# Patient Record
Sex: Female | Born: 1940 | Race: White | Hispanic: No | Marital: Single | Smoking: Former smoker
Health system: Southern US, Community
[De-identification: ages and names within clinical notes are randomized; demographics above are authoritative.]

## PROBLEM LIST (undated history)

## (undated) DIAGNOSIS — M1990 Unspecified osteoarthritis, unspecified site: Secondary | ICD-10-CM

## (undated) DIAGNOSIS — E559 Vitamin D deficiency, unspecified: Secondary | ICD-10-CM

## (undated) DIAGNOSIS — I10 Essential (primary) hypertension: Secondary | ICD-10-CM

## (undated) DIAGNOSIS — M8580 Other specified disorders of bone density and structure, unspecified site: Secondary | ICD-10-CM

## (undated) DIAGNOSIS — R200 Anesthesia of skin: Secondary | ICD-10-CM

## (undated) DIAGNOSIS — F32A Depression, unspecified: Secondary | ICD-10-CM

## (undated) DIAGNOSIS — E785 Hyperlipidemia, unspecified: Secondary | ICD-10-CM

## (undated) DIAGNOSIS — F329 Major depressive disorder, single episode, unspecified: Secondary | ICD-10-CM

## (undated) HISTORY — DX: Essential (primary) hypertension: I10

## (undated) HISTORY — DX: Anesthesia of skin: R20.0

## (undated) HISTORY — DX: Major depressive disorder, single episode, unspecified: F32.9

## (undated) HISTORY — DX: Depression, unspecified: F32.A

## (undated) HISTORY — PX: ABDOMINAL HYSTERECTOMY: SHX81

## (undated) HISTORY — DX: Vitamin D deficiency, unspecified: E55.9

## (undated) HISTORY — DX: Other specified disorders of bone density and structure, unspecified site: M85.80

---

## 1997-11-08 ENCOUNTER — Ambulatory Visit (HOSPITAL_COMMUNITY): Admission: RE | Admit: 1997-11-08 | Discharge: 1997-11-08 | Payer: Self-pay | Admitting: Gynecology

## 1997-12-13 ENCOUNTER — Ambulatory Visit: Admission: RE | Admit: 1997-12-13 | Discharge: 1997-12-13 | Payer: Self-pay | Admitting: Gynecology

## 1998-01-02 ENCOUNTER — Emergency Department (HOSPITAL_COMMUNITY): Admission: EM | Admit: 1998-01-02 | Discharge: 1998-01-02 | Payer: Self-pay | Admitting: Emergency Medicine

## 1998-09-01 ENCOUNTER — Encounter: Payer: Self-pay | Admitting: Family Medicine

## 1998-09-01 ENCOUNTER — Ambulatory Visit (HOSPITAL_COMMUNITY): Admission: RE | Admit: 1998-09-01 | Discharge: 1998-09-01 | Payer: Self-pay | Admitting: Family Medicine

## 1999-10-29 ENCOUNTER — Emergency Department (HOSPITAL_COMMUNITY): Admission: EM | Admit: 1999-10-29 | Discharge: 1999-10-29 | Payer: Self-pay | Admitting: Emergency Medicine

## 2000-04-10 ENCOUNTER — Other Ambulatory Visit: Admission: RE | Admit: 2000-04-10 | Discharge: 2000-04-10 | Payer: Self-pay | Admitting: Gynecology

## 2000-08-02 ENCOUNTER — Encounter: Payer: Self-pay | Admitting: Family Medicine

## 2000-08-02 ENCOUNTER — Ambulatory Visit (HOSPITAL_COMMUNITY): Admission: RE | Admit: 2000-08-02 | Discharge: 2000-08-02 | Payer: Self-pay | Admitting: Family Medicine

## 2001-05-06 ENCOUNTER — Other Ambulatory Visit: Admission: RE | Admit: 2001-05-06 | Discharge: 2001-05-06 | Payer: Self-pay | Admitting: Gynecology

## 2001-07-22 ENCOUNTER — Ambulatory Visit (HOSPITAL_COMMUNITY): Admission: RE | Admit: 2001-07-22 | Discharge: 2001-07-22 | Payer: Self-pay | Admitting: *Deleted

## 2001-07-28 ENCOUNTER — Encounter: Payer: Self-pay | Admitting: *Deleted

## 2001-07-28 ENCOUNTER — Ambulatory Visit (HOSPITAL_COMMUNITY): Admission: RE | Admit: 2001-07-28 | Discharge: 2001-07-28 | Payer: Self-pay | Admitting: *Deleted

## 2001-08-19 ENCOUNTER — Ambulatory Visit (HOSPITAL_COMMUNITY): Admission: RE | Admit: 2001-08-19 | Discharge: 2001-08-19 | Payer: Self-pay | Admitting: Gastroenterology

## 2001-08-19 ENCOUNTER — Encounter (INDEPENDENT_AMBULATORY_CARE_PROVIDER_SITE_OTHER): Payer: Self-pay | Admitting: Specialist

## 2001-09-15 ENCOUNTER — Encounter (INDEPENDENT_AMBULATORY_CARE_PROVIDER_SITE_OTHER): Payer: Self-pay | Admitting: *Deleted

## 2001-09-15 ENCOUNTER — Ambulatory Visit (HOSPITAL_BASED_OUTPATIENT_CLINIC_OR_DEPARTMENT_OTHER): Admission: RE | Admit: 2001-09-15 | Discharge: 2001-09-15 | Payer: Self-pay | Admitting: Surgery

## 2002-05-06 ENCOUNTER — Other Ambulatory Visit: Admission: RE | Admit: 2002-05-06 | Discharge: 2002-05-06 | Payer: Self-pay | Admitting: Gynecology

## 2005-08-15 ENCOUNTER — Encounter: Admission: RE | Admit: 2005-08-15 | Discharge: 2005-08-15 | Payer: Self-pay | Admitting: Emergency Medicine

## 2005-09-11 ENCOUNTER — Ambulatory Visit (HOSPITAL_COMMUNITY): Admission: RE | Admit: 2005-09-11 | Discharge: 2005-09-11 | Payer: Self-pay | Admitting: Specialist

## 2009-01-25 ENCOUNTER — Ambulatory Visit: Payer: Self-pay | Admitting: Gynecology

## 2009-01-25 ENCOUNTER — Other Ambulatory Visit: Admission: RE | Admit: 2009-01-25 | Discharge: 2009-01-25 | Payer: Self-pay | Admitting: Gynecology

## 2009-01-25 ENCOUNTER — Encounter: Payer: Self-pay | Admitting: Gynecology

## 2010-11-24 NOTE — Op Note (Signed)
[place]. [facility]  Patient:    Nancy Schmidt, Nancy Schmidt Visit Number: [id number] MRN: [id number]          Service Type: DSU Location: [facility] Attending Physician:  Charlton Haws Dictated by:   Currie Paris, M.D. Proc. Date: 09/15/01 Admit Date:  09/15/2001   CC:         Gladstone Pih, M.D.  Anselmo Rod, M.D.   Operative Report  VISIT NUMBER:  [id number]  [id number]  PREOPERATIVE DIAGNOSIS:  Perianal and intra-anal condylomata.  OPERATION:  Excision and laser of anal condylomata.  SURGEON:  Currie Paris, M.D.  ANESTHESIA:  General.  CLINICAL HISTORY:  The patient is a 70 year old, who has an external skin tag with what appears to be a large condyloma about 1 cm on the end of it.  There was one tiny, less than 1 mm adjacent what appeared to be condyloma.  The remainder of the external perianal area looked okay.  On internal exam, there appeared to be a small cluster of anal condyloma anteriorly and a couple of tiny ones posteriorly but otherwise free.  She has modest hemorrhoidal disease.  DESCRIPTION OF PROCEDURE:  The patient was seen in the holding area and had no further questions.  She was taken to the operating room and after satisfactory general anesthesia, was placed in the prone jackknife position.  The perianal area was prepped and draped.  I put some local using 0.25% Marcaine with epinephrine under the skin tag which was located on the left side and was fairly prominent.  I then did the anoscopic exam, as noted above with the findings as above.  I put a little local again underneath the little cluster of what appeared to be condylomata anteriorly.  I took a small biopsy of one just to confirm that the diagnosis was correct and then lasered all of the remaining area which was an area of about a square centimeter.  This looked fine with no significant other problems noted.  I then lasered the two little areas  anteriorly.  Attention was turned to the external one which was cut off because there was a fairly good skin tag associated with it.  The skin was reapproximated with a running 3-0 chromic.  There were a couple of tiny areas that were then lasered and then with the laser beam defocused, I "painted" the area around this to be sure we did not have any contamination here.  Final check was made with the scope again to make sure everything looked dry, no problems internally.  Everything there looked fine.  The area was dressed. The patient tolerated the procedure well.  There were no operative complications.  All counts were correct. Dictated by:   Currie Paris, M.D. Attending Physician:  Charlton Haws DD:  09/15/01 TD:  09/15/01 Job: [id number]

## 2010-11-24 NOTE — Procedures (Signed)
[place]. [facility]  Patient:    Nancy Schmidt, STANBROUGH Visit Number: [id number] MRN: [id number]          Service Type: END Location: ENDO Attending Physician:  Charna Elizabeth Dictated by:   Anselmo Rod, M.D. Proc. Date: 08/19/01 Admit Date:  08/19/2001   CC:         Juan H. Lily Peer, M.D.  Currie Paris, M.D.   Procedure Report  DATE OF BIRTH:  January 04, 1931.  PROCEDURE:  Colonoscopy with cold biopsy x1.  ENDOSCOPIST:  Anselmo Rod, M.D.  INSTRUMENT USED:  Olympus video colonoscope.  INDICATION FOR PROCEDURE:  Rectal bleeding in a 70 year old white female. Rule out colonic polyps, masses, hemorrhoids, etc.  PREPROCEDURE PREPARATION:  Informed consent was procured from the patient. The patient was fasted for eight hours prior to the procedure and prepped with a bottle of magnesium citrate and a gallon of NuLytely the night prior to the procedure.  PREPROCEDURE PHYSICAL:  VITAL SIGNS:  The patient had stable vital signs.  NECK:  Supple.  CHEST:  Clear to auscultation.  S1, S2 regular.  ABDOMEN:  Soft with normal bowel sounds.  DESCRIPTION OF PROCEDURE:  The patient was placed in the left lateral decubitus position and sedated with 70 mg of Demerol and 7 mg of Versed intravenously.  Once the patient was adequately sedate and maintained on low-flow oxygen and continuous cardiac monitoring, the Olympus video colonoscope was advanced from the rectum to the cecum without difficulty. Small external hemorrhoids were appreciated with small anal condyloma seen associated with external hemorrhoids.  On retroflexion in the rectum, internal condyloma was seen close to the dentate line.  There was some left-sided diverticulosis appreciated in addition to the small external and internal hemorrhoids.  No masses or polyps were seen in the colon except for a small sessile polyp that was biopsied from 20 cm.  The procedure was complete up to the  cecum.  The ileocecal valve and the appendiceal orifice were clearly visualized and photographed.  IMPRESSION: 1. Left-sided diverticulosis. 2. Small internal-external hemorrhoids. 3. A small anal condyloma associated with external hemorrhoids and condylomata    associated with internal hemorrhoids appreciated on retroflexion.  RECOMMENDATIONS: 1. Await pathology results. 2. Surgical evaluation with Dr. Cyndia Bent. 3. A high-fiber diet was advised for diverticular disease. 4. Outpatient follow-up in the next four weeks. Dictated by:   Anselmo Rod, M.D. Attending Physician:  Charna Elizabeth DD:  08/19/01 TD:  08/20/01 Job: [id number]

## 2011-08-03 DIAGNOSIS — Z0000 Encounter for general adult medical examination without abnormal findings: Secondary | ICD-10-CM | POA: Diagnosis not present

## 2011-08-03 DIAGNOSIS — Z1331 Encounter for screening for depression: Secondary | ICD-10-CM | POA: Diagnosis not present

## 2011-08-03 DIAGNOSIS — E559 Vitamin D deficiency, unspecified: Secondary | ICD-10-CM | POA: Diagnosis not present

## 2011-08-03 DIAGNOSIS — Z79899 Other long term (current) drug therapy: Secondary | ICD-10-CM | POA: Diagnosis not present

## 2011-08-03 DIAGNOSIS — F329 Major depressive disorder, single episode, unspecified: Secondary | ICD-10-CM | POA: Diagnosis not present

## 2011-08-03 DIAGNOSIS — E785 Hyperlipidemia, unspecified: Secondary | ICD-10-CM | POA: Diagnosis not present

## 2011-08-03 DIAGNOSIS — E663 Overweight: Secondary | ICD-10-CM | POA: Diagnosis not present

## 2011-09-13 DIAGNOSIS — M999 Biomechanical lesion, unspecified: Secondary | ICD-10-CM | POA: Diagnosis not present

## 2011-09-13 DIAGNOSIS — M545 Low back pain: Secondary | ICD-10-CM | POA: Diagnosis not present

## 2011-09-13 DIAGNOSIS — M5137 Other intervertebral disc degeneration, lumbosacral region: Secondary | ICD-10-CM | POA: Diagnosis not present

## 2011-09-13 DIAGNOSIS — M4716 Other spondylosis with myelopathy, lumbar region: Secondary | ICD-10-CM | POA: Diagnosis not present

## 2011-11-08 DIAGNOSIS — M5137 Other intervertebral disc degeneration, lumbosacral region: Secondary | ICD-10-CM | POA: Diagnosis not present

## 2011-11-08 DIAGNOSIS — M545 Low back pain: Secondary | ICD-10-CM | POA: Diagnosis not present

## 2011-11-08 DIAGNOSIS — M4716 Other spondylosis with myelopathy, lumbar region: Secondary | ICD-10-CM | POA: Diagnosis not present

## 2011-11-08 DIAGNOSIS — M999 Biomechanical lesion, unspecified: Secondary | ICD-10-CM | POA: Diagnosis not present

## 2012-02-26 DIAGNOSIS — H35369 Drusen (degenerative) of macula, unspecified eye: Secondary | ICD-10-CM | POA: Diagnosis not present

## 2012-02-26 DIAGNOSIS — H35039 Hypertensive retinopathy, unspecified eye: Secondary | ICD-10-CM | POA: Diagnosis not present

## 2012-02-26 DIAGNOSIS — H16149 Punctate keratitis, unspecified eye: Secondary | ICD-10-CM | POA: Diagnosis not present

## 2012-02-26 DIAGNOSIS — H2510 Age-related nuclear cataract, unspecified eye: Secondary | ICD-10-CM | POA: Diagnosis not present

## 2012-02-26 DIAGNOSIS — H43819 Vitreous degeneration, unspecified eye: Secondary | ICD-10-CM | POA: Diagnosis not present

## 2012-02-26 DIAGNOSIS — D3130 Benign neoplasm of unspecified choroid: Secondary | ICD-10-CM | POA: Diagnosis not present

## 2012-03-13 DIAGNOSIS — L570 Actinic keratosis: Secondary | ICD-10-CM | POA: Diagnosis not present

## 2012-03-18 DIAGNOSIS — Z1231 Encounter for screening mammogram for malignant neoplasm of breast: Secondary | ICD-10-CM | POA: Diagnosis not present

## 2012-05-21 DIAGNOSIS — IMO0002 Reserved for concepts with insufficient information to code with codable children: Secondary | ICD-10-CM | POA: Diagnosis not present

## 2012-05-21 DIAGNOSIS — M47814 Spondylosis without myelopathy or radiculopathy, thoracic region: Secondary | ICD-10-CM | POA: Diagnosis not present

## 2012-05-21 DIAGNOSIS — M999 Biomechanical lesion, unspecified: Secondary | ICD-10-CM | POA: Diagnosis not present

## 2012-05-21 DIAGNOSIS — M546 Pain in thoracic spine: Secondary | ICD-10-CM | POA: Diagnosis not present

## 2012-08-07 DIAGNOSIS — F329 Major depressive disorder, single episode, unspecified: Secondary | ICD-10-CM | POA: Diagnosis not present

## 2012-08-07 DIAGNOSIS — Z0000 Encounter for general adult medical examination without abnormal findings: Secondary | ICD-10-CM | POA: Diagnosis not present

## 2012-08-07 DIAGNOSIS — E559 Vitamin D deficiency, unspecified: Secondary | ICD-10-CM | POA: Diagnosis not present

## 2012-08-07 DIAGNOSIS — E785 Hyperlipidemia, unspecified: Secondary | ICD-10-CM | POA: Diagnosis not present

## 2012-08-07 DIAGNOSIS — Z79899 Other long term (current) drug therapy: Secondary | ICD-10-CM | POA: Diagnosis not present

## 2012-10-09 DIAGNOSIS — E785 Hyperlipidemia, unspecified: Secondary | ICD-10-CM | POA: Diagnosis not present

## 2012-10-09 DIAGNOSIS — E559 Vitamin D deficiency, unspecified: Secondary | ICD-10-CM | POA: Diagnosis not present

## 2012-10-13 DIAGNOSIS — L723 Sebaceous cyst: Secondary | ICD-10-CM | POA: Diagnosis not present

## 2012-10-13 DIAGNOSIS — D485 Neoplasm of uncertain behavior of skin: Secondary | ICD-10-CM | POA: Diagnosis not present

## 2012-10-13 DIAGNOSIS — L738 Other specified follicular disorders: Secondary | ICD-10-CM | POA: Diagnosis not present

## 2013-01-05 DIAGNOSIS — M25549 Pain in joints of unspecified hand: Secondary | ICD-10-CM | POA: Diagnosis not present

## 2013-04-13 DIAGNOSIS — E785 Hyperlipidemia, unspecified: Secondary | ICD-10-CM | POA: Diagnosis not present

## 2013-04-13 DIAGNOSIS — E559 Vitamin D deficiency, unspecified: Secondary | ICD-10-CM | POA: Diagnosis not present

## 2013-06-23 DIAGNOSIS — E559 Vitamin D deficiency, unspecified: Secondary | ICD-10-CM | POA: Diagnosis not present

## 2013-07-24 DIAGNOSIS — M25519 Pain in unspecified shoulder: Secondary | ICD-10-CM | POA: Diagnosis not present

## 2013-08-11 DIAGNOSIS — R5381 Other malaise: Secondary | ICD-10-CM | POA: Diagnosis not present

## 2013-08-11 DIAGNOSIS — Z79899 Other long term (current) drug therapy: Secondary | ICD-10-CM | POA: Diagnosis not present

## 2013-08-11 DIAGNOSIS — Z0000 Encounter for general adult medical examination without abnormal findings: Secondary | ICD-10-CM | POA: Diagnosis not present

## 2013-08-11 DIAGNOSIS — E559 Vitamin D deficiency, unspecified: Secondary | ICD-10-CM | POA: Diagnosis not present

## 2013-08-11 DIAGNOSIS — R5383 Other fatigue: Secondary | ICD-10-CM | POA: Diagnosis not present

## 2013-08-11 DIAGNOSIS — M899 Disorder of bone, unspecified: Secondary | ICD-10-CM | POA: Diagnosis not present

## 2013-08-11 DIAGNOSIS — E039 Hypothyroidism, unspecified: Secondary | ICD-10-CM | POA: Diagnosis not present

## 2013-08-11 DIAGNOSIS — E785 Hyperlipidemia, unspecified: Secondary | ICD-10-CM | POA: Diagnosis not present

## 2013-08-11 DIAGNOSIS — M949 Disorder of cartilage, unspecified: Secondary | ICD-10-CM | POA: Diagnosis not present

## 2013-08-11 DIAGNOSIS — E663 Overweight: Secondary | ICD-10-CM | POA: Diagnosis not present

## 2013-08-18 DIAGNOSIS — H2510 Age-related nuclear cataract, unspecified eye: Secondary | ICD-10-CM | POA: Diagnosis not present

## 2013-10-26 DIAGNOSIS — Z1231 Encounter for screening mammogram for malignant neoplasm of breast: Secondary | ICD-10-CM | POA: Diagnosis not present

## 2013-11-02 DIAGNOSIS — IMO0002 Reserved for concepts with insufficient information to code with codable children: Secondary | ICD-10-CM | POA: Diagnosis not present

## 2013-11-02 DIAGNOSIS — S0990XA Unspecified injury of head, initial encounter: Secondary | ICD-10-CM | POA: Diagnosis not present

## 2014-01-12 DIAGNOSIS — L570 Actinic keratosis: Secondary | ICD-10-CM | POA: Diagnosis not present

## 2014-01-12 DIAGNOSIS — L821 Other seborrheic keratosis: Secondary | ICD-10-CM | POA: Diagnosis not present

## 2014-01-12 DIAGNOSIS — I789 Disease of capillaries, unspecified: Secondary | ICD-10-CM | POA: Diagnosis not present

## 2014-02-02 DIAGNOSIS — L570 Actinic keratosis: Secondary | ICD-10-CM | POA: Diagnosis not present

## 2014-02-26 DIAGNOSIS — F329 Major depressive disorder, single episode, unspecified: Secondary | ICD-10-CM | POA: Diagnosis not present

## 2014-02-26 DIAGNOSIS — E785 Hyperlipidemia, unspecified: Secondary | ICD-10-CM | POA: Diagnosis not present

## 2014-02-26 DIAGNOSIS — E559 Vitamin D deficiency, unspecified: Secondary | ICD-10-CM | POA: Diagnosis not present

## 2014-03-29 DIAGNOSIS — IMO0002 Reserved for concepts with insufficient information to code with codable children: Secondary | ICD-10-CM | POA: Diagnosis not present

## 2014-03-29 DIAGNOSIS — M546 Pain in thoracic spine: Secondary | ICD-10-CM | POA: Diagnosis not present

## 2014-03-29 DIAGNOSIS — M47814 Spondylosis without myelopathy or radiculopathy, thoracic region: Secondary | ICD-10-CM | POA: Diagnosis not present

## 2014-03-29 DIAGNOSIS — M999 Biomechanical lesion, unspecified: Secondary | ICD-10-CM | POA: Diagnosis not present

## 2014-03-30 DIAGNOSIS — M546 Pain in thoracic spine: Secondary | ICD-10-CM | POA: Diagnosis not present

## 2014-03-30 DIAGNOSIS — M47814 Spondylosis without myelopathy or radiculopathy, thoracic region: Secondary | ICD-10-CM | POA: Diagnosis not present

## 2014-03-30 DIAGNOSIS — M999 Biomechanical lesion, unspecified: Secondary | ICD-10-CM | POA: Diagnosis not present

## 2014-03-30 DIAGNOSIS — IMO0002 Reserved for concepts with insufficient information to code with codable children: Secondary | ICD-10-CM | POA: Diagnosis not present

## 2014-03-31 DIAGNOSIS — M47814 Spondylosis without myelopathy or radiculopathy, thoracic region: Secondary | ICD-10-CM | POA: Diagnosis not present

## 2014-03-31 DIAGNOSIS — M999 Biomechanical lesion, unspecified: Secondary | ICD-10-CM | POA: Diagnosis not present

## 2014-03-31 DIAGNOSIS — IMO0002 Reserved for concepts with insufficient information to code with codable children: Secondary | ICD-10-CM | POA: Diagnosis not present

## 2014-03-31 DIAGNOSIS — M546 Pain in thoracic spine: Secondary | ICD-10-CM | POA: Diagnosis not present

## 2014-04-01 DIAGNOSIS — M546 Pain in thoracic spine: Secondary | ICD-10-CM | POA: Diagnosis not present

## 2014-04-01 DIAGNOSIS — IMO0002 Reserved for concepts with insufficient information to code with codable children: Secondary | ICD-10-CM | POA: Diagnosis not present

## 2014-04-01 DIAGNOSIS — M999 Biomechanical lesion, unspecified: Secondary | ICD-10-CM | POA: Diagnosis not present

## 2014-04-01 DIAGNOSIS — M47814 Spondylosis without myelopathy or radiculopathy, thoracic region: Secondary | ICD-10-CM | POA: Diagnosis not present

## 2014-04-02 DIAGNOSIS — M999 Biomechanical lesion, unspecified: Secondary | ICD-10-CM | POA: Diagnosis not present

## 2014-04-02 DIAGNOSIS — M47814 Spondylosis without myelopathy or radiculopathy, thoracic region: Secondary | ICD-10-CM | POA: Diagnosis not present

## 2014-04-02 DIAGNOSIS — IMO0002 Reserved for concepts with insufficient information to code with codable children: Secondary | ICD-10-CM | POA: Diagnosis not present

## 2014-04-02 DIAGNOSIS — M546 Pain in thoracic spine: Secondary | ICD-10-CM | POA: Diagnosis not present

## 2014-04-05 ENCOUNTER — Other Ambulatory Visit: Payer: Self-pay | Admitting: Gastroenterology

## 2014-04-05 DIAGNOSIS — Z1211 Encounter for screening for malignant neoplasm of colon: Secondary | ICD-10-CM | POA: Diagnosis not present

## 2014-04-05 DIAGNOSIS — K620 Anal polyp: Secondary | ICD-10-CM | POA: Diagnosis not present

## 2014-04-05 DIAGNOSIS — K5730 Diverticulosis of large intestine without perforation or abscess without bleeding: Secondary | ICD-10-CM | POA: Diagnosis not present

## 2014-04-05 DIAGNOSIS — K621 Rectal polyp: Secondary | ICD-10-CM | POA: Diagnosis not present

## 2014-04-05 DIAGNOSIS — Z8371 Family history of colonic polyps: Secondary | ICD-10-CM | POA: Diagnosis not present

## 2014-04-05 DIAGNOSIS — D126 Benign neoplasm of colon, unspecified: Secondary | ICD-10-CM | POA: Diagnosis not present

## 2014-04-06 DIAGNOSIS — M546 Pain in thoracic spine: Secondary | ICD-10-CM | POA: Diagnosis not present

## 2014-04-06 DIAGNOSIS — IMO0002 Reserved for concepts with insufficient information to code with codable children: Secondary | ICD-10-CM | POA: Diagnosis not present

## 2014-04-06 DIAGNOSIS — M999 Biomechanical lesion, unspecified: Secondary | ICD-10-CM | POA: Diagnosis not present

## 2014-04-06 DIAGNOSIS — M47814 Spondylosis without myelopathy or radiculopathy, thoracic region: Secondary | ICD-10-CM | POA: Diagnosis not present

## 2014-04-12 DIAGNOSIS — M5134 Other intervertebral disc degeneration, thoracic region: Secondary | ICD-10-CM | POA: Diagnosis not present

## 2014-04-12 DIAGNOSIS — M9902 Segmental and somatic dysfunction of thoracic region: Secondary | ICD-10-CM | POA: Diagnosis not present

## 2014-04-12 DIAGNOSIS — M47814 Spondylosis without myelopathy or radiculopathy, thoracic region: Secondary | ICD-10-CM | POA: Diagnosis not present

## 2014-04-12 DIAGNOSIS — M546 Pain in thoracic spine: Secondary | ICD-10-CM | POA: Diagnosis not present

## 2014-04-16 DIAGNOSIS — E039 Hypothyroidism, unspecified: Secondary | ICD-10-CM | POA: Diagnosis not present

## 2014-04-16 DIAGNOSIS — E559 Vitamin D deficiency, unspecified: Secondary | ICD-10-CM | POA: Diagnosis not present

## 2014-04-16 DIAGNOSIS — B0089 Other herpesviral infection: Secondary | ICD-10-CM | POA: Diagnosis not present

## 2014-04-16 DIAGNOSIS — M899 Disorder of bone, unspecified: Secondary | ICD-10-CM | POA: Diagnosis not present

## 2014-04-16 DIAGNOSIS — F322 Major depressive disorder, single episode, severe without psychotic features: Secondary | ICD-10-CM | POA: Diagnosis not present

## 2014-04-16 DIAGNOSIS — Z23 Encounter for immunization: Secondary | ICD-10-CM | POA: Diagnosis not present

## 2014-04-16 DIAGNOSIS — H6121 Impacted cerumen, right ear: Secondary | ICD-10-CM | POA: Diagnosis not present

## 2014-04-16 DIAGNOSIS — K649 Unspecified hemorrhoids: Secondary | ICD-10-CM | POA: Diagnosis not present

## 2014-04-16 DIAGNOSIS — E785 Hyperlipidemia, unspecified: Secondary | ICD-10-CM | POA: Diagnosis not present

## 2014-04-19 DIAGNOSIS — M5134 Other intervertebral disc degeneration, thoracic region: Secondary | ICD-10-CM | POA: Diagnosis not present

## 2014-04-19 DIAGNOSIS — M9902 Segmental and somatic dysfunction of thoracic region: Secondary | ICD-10-CM | POA: Diagnosis not present

## 2014-04-19 DIAGNOSIS — M546 Pain in thoracic spine: Secondary | ICD-10-CM | POA: Diagnosis not present

## 2014-04-19 DIAGNOSIS — M47814 Spondylosis without myelopathy or radiculopathy, thoracic region: Secondary | ICD-10-CM | POA: Diagnosis not present

## 2014-04-27 DIAGNOSIS — M5134 Other intervertebral disc degeneration, thoracic region: Secondary | ICD-10-CM | POA: Diagnosis not present

## 2014-04-27 DIAGNOSIS — M47814 Spondylosis without myelopathy or radiculopathy, thoracic region: Secondary | ICD-10-CM | POA: Diagnosis not present

## 2014-04-27 DIAGNOSIS — M9902 Segmental and somatic dysfunction of thoracic region: Secondary | ICD-10-CM | POA: Diagnosis not present

## 2014-04-27 DIAGNOSIS — M546 Pain in thoracic spine: Secondary | ICD-10-CM | POA: Diagnosis not present

## 2014-05-06 DIAGNOSIS — H903 Sensorineural hearing loss, bilateral: Secondary | ICD-10-CM | POA: Diagnosis not present

## 2014-05-06 DIAGNOSIS — H6123 Impacted cerumen, bilateral: Secondary | ICD-10-CM | POA: Diagnosis not present

## 2014-05-07 DIAGNOSIS — E559 Vitamin D deficiency, unspecified: Secondary | ICD-10-CM | POA: Diagnosis not present

## 2014-05-11 DIAGNOSIS — M9902 Segmental and somatic dysfunction of thoracic region: Secondary | ICD-10-CM | POA: Diagnosis not present

## 2014-05-11 DIAGNOSIS — M47814 Spondylosis without myelopathy or radiculopathy, thoracic region: Secondary | ICD-10-CM | POA: Diagnosis not present

## 2014-05-11 DIAGNOSIS — M5134 Other intervertebral disc degeneration, thoracic region: Secondary | ICD-10-CM | POA: Diagnosis not present

## 2014-05-11 DIAGNOSIS — M546 Pain in thoracic spine: Secondary | ICD-10-CM | POA: Diagnosis not present

## 2014-10-15 DIAGNOSIS — M79674 Pain in right toe(s): Secondary | ICD-10-CM | POA: Diagnosis not present

## 2014-10-15 DIAGNOSIS — R109 Unspecified abdominal pain: Secondary | ICD-10-CM | POA: Diagnosis not present

## 2015-02-16 ENCOUNTER — Encounter (HOSPITAL_COMMUNITY): Payer: Self-pay | Admitting: Nurse Practitioner

## 2015-02-16 ENCOUNTER — Observation Stay (HOSPITAL_COMMUNITY)
Admission: EM | Admit: 2015-02-16 | Discharge: 2015-02-17 | Disposition: A | Discharge status: Home / Self Care | Payer: Medicare Other | Attending: Cardiovascular Disease | Admitting: Cardiovascular Disease

## 2015-02-16 ENCOUNTER — Emergency Department (HOSPITAL_COMMUNITY): Payer: Medicare Other

## 2015-02-16 DIAGNOSIS — Z8249 Family history of ischemic heart disease and other diseases of the circulatory system: Secondary | ICD-10-CM | POA: Diagnosis not present

## 2015-02-16 DIAGNOSIS — E785 Hyperlipidemia, unspecified: Secondary | ICD-10-CM | POA: Diagnosis not present

## 2015-02-16 DIAGNOSIS — R0602 Shortness of breath: Secondary | ICD-10-CM | POA: Insufficient documentation

## 2015-02-16 DIAGNOSIS — I10 Essential (primary) hypertension: Secondary | ICD-10-CM | POA: Diagnosis not present

## 2015-02-16 DIAGNOSIS — I200 Unstable angina: Secondary | ICD-10-CM | POA: Diagnosis not present

## 2015-02-16 DIAGNOSIS — R079 Chest pain, unspecified: Secondary | ICD-10-CM | POA: Diagnosis not present

## 2015-02-16 DIAGNOSIS — R030 Elevated blood-pressure reading, without diagnosis of hypertension: Secondary | ICD-10-CM

## 2015-02-16 DIAGNOSIS — I700 Atherosclerosis of aorta: Secondary | ICD-10-CM | POA: Diagnosis not present

## 2015-02-16 DIAGNOSIS — M79603 Pain in arm, unspecified: Secondary | ICD-10-CM | POA: Diagnosis not present

## 2015-02-16 DIAGNOSIS — R001 Bradycardia, unspecified: Secondary | ICD-10-CM | POA: Diagnosis not present

## 2015-02-16 DIAGNOSIS — Z87891 Personal history of nicotine dependence: Secondary | ICD-10-CM | POA: Diagnosis not present

## 2015-02-16 DIAGNOSIS — Z7982 Long term (current) use of aspirin: Secondary | ICD-10-CM | POA: Insufficient documentation

## 2015-02-16 DIAGNOSIS — R262 Difficulty in walking, not elsewhere classified: Secondary | ICD-10-CM | POA: Diagnosis not present

## 2015-02-16 DIAGNOSIS — IMO0001 Reserved for inherently not codable concepts without codable children: Secondary | ICD-10-CM

## 2015-02-16 DIAGNOSIS — Z79899 Other long term (current) drug therapy: Secondary | ICD-10-CM | POA: Insufficient documentation

## 2015-02-16 HISTORY — DX: Hyperlipidemia, unspecified: E78.5

## 2015-02-16 HISTORY — DX: Unspecified osteoarthritis, unspecified site: M19.90

## 2015-02-16 LAB — I-STAT TROPONIN, ED: TROPONIN I, POC: 0 ng/mL (ref 0.00–0.08)

## 2015-02-16 LAB — CBC
HCT: 40.9 % (ref 36.0–46.0)
Hemoglobin: 13.5 g/dL (ref 12.0–15.0)
MCH: 30.4 pg (ref 26.0–34.0)
MCHC: 33 g/dL (ref 30.0–36.0)
MCV: 92.1 fL (ref 78.0–100.0)
Platelets: 244 10*3/uL (ref 150–400)
RBC: 4.44 MIL/uL (ref 3.87–5.11)
RDW: 13 % (ref 11.5–15.5)
WBC: 7 10*3/uL (ref 4.0–10.5)

## 2015-02-16 LAB — BASIC METABOLIC PANEL
ANION GAP: 11 (ref 5–15)
BUN: <5 mg/dL — ABNORMAL LOW (ref 6–20)
CO2: 23 mmol/L (ref 22–32)
Calcium: 9.1 mg/dL (ref 8.9–10.3)
Chloride: 105 mmol/L (ref 101–111)
Creatinine, Ser: 0.71 mg/dL (ref 0.44–1.00)
Glucose, Bld: 96 mg/dL (ref 65–99)
POTASSIUM: 3.9 mmol/L (ref 3.5–5.1)
Sodium: 139 mmol/L (ref 135–145)

## 2015-02-16 NOTE — ED Notes (Signed)
She c/o several day history of L sided chest pain radiating into L jaw and numbness in L arm, worse with exertion today. Describes as "clutching tightness." she can not tell if it is her arthritis or cardiac. She reports some SOB. No nausea,. A&Ox4, resp e/u

## 2015-02-16 NOTE — ED Notes (Signed)
Attempted to give report to floor 

## 2015-02-16 NOTE — ED Provider Notes (Signed)
CSN: [id number]     Arrival date & time 02/16/15  1524 History   First MD Initiated Contact with Patient 02/16/15 1651     Chief Complaint  Patient presents with  . Chest Pain     (Consider location/radiation/quality/duration/timing/severity/associated sxs/prior Treatment) HPI Comments: The patient is a 74 year old female, she has no known cardiac history but does have a history of untreated hyperlipidemia (did not tolerate the oral statin). She has a history of tobacco use but stopped over 20 years ago, in the last few months she has had some difficulty with ambulation because her legs become weak when she walks, this goes away with rest. Today while walking her dog she had developed chest heaviness with radiation to her left arm and neck and jaw. This lasted a little bit longer, it then resolved spontaneously and at this time her symptoms are mild. She denies any swelling of the legs, there has been no headaches, vomiting, cough, fever, dysuria or diarrhea.  Patient is a 74 y.o. female presenting with chest pain. The history is provided by the patient.  Chest Pain   Past Medical History  Diagnosis Date  . Arthritis   . Hyperlipidemia    Past Surgical History  Procedure Laterality Date  . Abdominal hysterectomy     Family History  Problem Relation Age of Onset  . Heart attack Mother    Social History  Substance Use Topics  . Smoking status: Never Smoker   . Smokeless tobacco: Never Used  . Alcohol Use: 0.0 oz/week    0 Standard drinks or equivalent per week     Comment: 2 drinks/day   OB History    No data available     Review of Systems  Cardiovascular: Positive for chest pain.  All other systems reviewed and are negative.     Allergies  Other  Home Medications   Prior to Admission medications   Medication Sig Start Date End Date Taking? Authorizing Provider  aspirin EC 81 MG tablet Take 81 mg by mouth 2 (two) times a week.    Yes Historical Provider, MD   FLUoxetine (PROZAC) 10 MG capsule Take 10 mg by mouth daily.   Yes Historical Provider, MD  Multiple Vitamin (MULTIVITAMIN WITH MINERALS) TABS tablet Take 1 tablet by mouth daily.   Yes Historical Provider, MD   BP 148/62 mmHg  Pulse 52  Temp(Src) 97.9 F (36.6 C) (Oral)  Resp 18  SpO2 97% Physical Exam  Constitutional: She appears well-developed and well-nourished. No distress.  HENT:  Head: Normocephalic and atraumatic.  Mouth/Throat: Oropharynx is clear and moist. No oropharyngeal exudate.  Eyes: Conjunctivae and EOM are normal. Pupils are equal, round, and reactive to light. Right eye exhibits no discharge. Left eye exhibits no discharge. No scleral icterus.  Neck: Normal range of motion. Neck supple. No JVD present. No thyromegaly present.  Cardiovascular: Normal rate, regular rhythm, normal heart sounds and intact distal pulses.  Exam reveals no gallop and no friction rub.   No murmur heard. Pulmonary/Chest: Effort normal and breath sounds normal. No respiratory distress. She has no wheezes. She has no rales.  Abdominal: Soft. Bowel sounds are normal. She exhibits no distension and no mass. There is no tenderness.  Musculoskeletal: Normal range of motion. She exhibits no edema or tenderness.  Lymphadenopathy:    She has no cervical adenopathy.  Neurological: She is alert. Coordination normal.  Skin: Skin is warm and dry. No rash noted. No erythema.  Psychiatric: She has a  normal mood and affect. Her behavior is normal.  Nursing note and vitals reviewed.   ED Course  Procedures (including critical care time) Labs Review Labs Reviewed  BASIC METABOLIC PANEL - Abnormal; Notable for the following:    BUN <5 (*)    All other components within normal limits  CBC  I-STAT TROPOININ, ED    Imaging Review Dg Chest 2 View  02/16/2015   CLINICAL DATA:  Left chest and arm pain.  Symptoms for 3 days.  EXAM: CHEST  2 VIEW  COMPARISON:  08/15/2005  FINDINGS: Both lungs are clear.  Heart and mediastinum are within normal limits. Calcifications at the aortic arch. Old left clavicle fracture. No large pleural effusions. Mild degenerative changes in the thoracic spine.  IMPRESSION: No active cardiopulmonary disease.   Electronically Signed   By: Markus Daft M.D.   On: 02/16/2015 15:52     EKG Interpretation   Date/Time:  Wednesday February 16 2015 15:30:44 EDT Ventricular Rate:  66 PR Interval:  166 QRS Duration: 74 QT Interval:  412 QTC Calculation: 431 R Axis:   43 Text Interpretation:  Normal sinus rhythm Normal ECG since last tracing no  significant change Confirmed by Sabra Heck  MD, Laurelai Lepp ([id number]) on 02/16/2015  4:53:44 PM      MDM   Final diagnoses:  Unstable angina  Essential hypertension    Physical exam was unremarkable, her heart rate is mildly bradycardic, her EKG is unremarkable and her troponin is normal. She does however have textbook unstable angina symptoms and thus will need cardiology consultation.  I have personally viewed and interpreted the imaging and agree with radiologist interpretation.   Cardiology consulted at 6:30, they will admit the patient to the hospital for further evaluation and testing.  Meds given in ED:  Medications - No data to display  New Prescriptions   No medications on file        Noemi Chapel, MD 02/16/15 [phone]

## 2015-02-16 NOTE — H&P (Signed)
Patient ID: Nancy Schmidt MRN: [id number], DOB/AGE: 1941-04-17   Admit date: 02/16/2015   Primary Physician: Dr. Alegra [facility] Primary Cardiologist: New  Pt. Profile: Nancy Schmidt is a 74 yo female w/ hx of HLD (not on any current medical therapy), previous tobacco use (30 pack-year history) and remote non-ischemic stress test (15 years ago, possibly by Dr Glade Lloyd, who has since retired) admitted for unstable angina on 02/16/15.  LPF:[id number] Nancy Schmidt is a 74 y.o. female with a history of HLD (not on current medical therapy) who presented to the ED on 02/16/15 for chest pain. Around 3:00 this afternoon she was walking her dog up an incline in her neighborhood and suddenly developed chest pain with exertion. She reports having walked this incline daily for several months and has never experienced anything like this before while doing so. She describes the pain as more of a "pressure" and felt as if something were ripping at her chest. She also experienced radiating pain into her left arm, left leg, and the left side of her neck. She reports the pain was 3/10 at its worse. Her husband brought her to the ED, where since then her pain has resolved. Initial troponin was negative.  With this episode, she also noted shortness of breath, which has also resolved since being in the ED. Her husband adds that she has complained about being fatigued over the past month when taking her outdoor walks or walking around the store.    She denies any palpitations, nausea, vomiting, syncope, orthopnea, or edema.    Family history is significant for patient's mother having an MI and CHF (deceased at age 64). Two maternal aunts also had CABG procedures.  Social history significant for 30 pack-year history (Quit 20 years ago) and consuming 2 drinks per day, 7 days per week.  Problem List Past Medical History  Diagnosis Date  . Arthritis   . Hyperlipidemia     Past Surgical History  Procedure  Laterality Date  . Abdominal hysterectomy       Home Medications Prior to Admission medications   Medication Sig Start Date End Date Taking? Authorizing Provider  aspirin EC 81 MG tablet Take 81 mg by mouth 2 (two) times a week.    Yes Historical Provider, MD  FLUoxetine (PROZAC) 10 MG capsule Take 10 mg by mouth daily.   Yes Historical Provider, MD  Multiple Vitamin (MULTIVITAMIN WITH MINERALS) TABS tablet Take 1 tablet by mouth daily.   Yes Historical Provider, MD    Allergies Allergies  Allergen Reactions  . Other Hives    Sporanox-old antifungal    Past Medical History Past Medical History  Diagnosis Date  . Arthritis   . Hyperlipidemia      Surgical History  Past Surgical History  Procedure Laterality Date  . Abdominal hysterectomy       Family History Family Status  Relation Status Death Age  . Mother Deceased   . Father Deceased    Family History  Problem Relation Age of Onset  . Heart attack Mother     Deceased at age 50  . Heart failure Mother   . Coronary artery disease Maternal Aunt     Social History Social History   Social History  . Marital Status: Single    Spouse Name: N/A  . Number of Children: N/A  . Years of Education: N/A   Occupational History  . Retired    Social History Main Topics  .  Smoking status: Former Smoker -- 1.00 packs/day for 30 years    Types: Cigarettes    Quit date: 02/16/1995  . Smokeless tobacco: Never Used  . Alcohol Use: 8.4 oz/week    14 Standard drinks or equivalent per week     Comment: 2 drinks/day  . Drug Use: No  . Sexual Activity: Not on file   Other Topics Concern  . Not on file   Social History Narrative   Lives with husband in [place]. Mother and aunts hx CAD.     Review of Systems General:  No chills, fever, night sweats or weight changes.  Cardiovascular:  Positive for chest pain and dyspnea on exertion. Denies edema, orthopnea, and palpitations. Respiratory: Positive for shortness of  breath. Denies cough. Abdominal:   No nausea, vomiting, diarrhea, bright red blood per rectum, melena, or hematemesis Neurologic:  No visual changes, wkns, changes in mental status. All other systems reviewed and are otherwise negative except as noted above.   Physical Exam: Blood pressure 175/76, pulse 56, temperature 97.9 F (36.6 Nancy), temperature source Oral, resp. rate 16, SpO2 97 %.  General: Well developed, well nourished,female in no acute distress. Head: Normocephalic, atraumatic. Dentition: good.  Neck: No carotid bruits. JVD not elevated.  Lungs: Respirations regular and unlabored, without wheezes or rales.  Heart: Regular rate and rhythm. No S3 or S4.  No murmur, no rubs, or gallops appreciated. Abdomen: Soft, non-tender, non-distended with normoactive bowel sounds. No hepatomegaly. No rebound/guarding. No obvious abdominal masses. Msk:  Strength and tone appear normal for age. No joint deformities or effusions. Extremities: No clubbing or cyanosis. No edema.  Distal pedal pulses are 2+ bilaterally. Neuro: Alert and oriented X 3. Moves all extremities spontaneously. No focal deficits noted. Psych:  Responds to questions appropriately with a normal affect. Skin: No rashes or lesions noted   Labs Lab Results  Component Value Date   WBC 7.0 02/16/2015   HGB 13.5 02/16/2015   HCT 40.9 02/16/2015   MCV 92.1 02/16/2015   PLT 244 02/16/2015     Recent Labs Lab 02/16/15 1533  NA 139  K 3.9  CL 105  CO2 23  BUN <5*  CREATININE 0.71  CALCIUM 9.1  GLUCOSE 96   Troponin (Point of Care Test)  Recent Labs  02/16/15 1606  TROPIPOC 0.00    ECG: 02/16/2015 SR, no acute ischemic changes Q waves noted in V1 and V2 No old available for comparison  Radiology/Studies: Dg Chest 2 View: 02/16/2015   CLINICAL DATA:  Left chest and arm pain.  Symptoms for 3 days.  EXAM: CHEST  2 VIEW  COMPARISON:  08/15/2005  FINDINGS: Both lungs are clear. Heart and mediastinum are within  normal limits. Calcifications at the aortic arch. Old left clavicle fracture. No large pleural effusions. Mild degenerative changes in the thoracic spine.  IMPRESSION: No active cardiopulmonary disease.   Electronically Signed   By: Markus Daft M.D.   On: 02/16/2015 15:52    ASSESSMENT AND PLAN:  1. Chest pain with moderate risk for cardiac etiology - Known history of HLD and former tobacco use, but not sure of any history of HTN. Previous cardiac work-up 15+ years ago consisting of Echo and Exercise stress test which patient reports were normal. Her current clinical picture is concerning for unstable angina. - Will admit to telemetry and continue to cycle troponin values.  - Will obtain CMP, BNP, and Lipid Panel. - Schedule for Myoview in AM. Will Make NPO after midnight.  -  Will start ASA 81mg  daily and Lisinopril 10mg  daily. - Hold off on starting BB due to her HR being in the 50's at this time.  2. Hyperlipidemia - Patient not currently taking any medications - Will obtain fasting lipid panel in AM  3. Elevated BP - Her BP has been elevated while in the ED (148/62 - 184/76). Unsure if she carries a diagnosis of HTN or if elevation is due to situational factors. Will start Lisinopril 10mg  once daily for now.   Signed, Dineen Kid, PA-Nancy 02/16/2015, 6:55 PM   History and all data above reviewed.  Patient examined.  I agree with the findings as above.  The patient exam reveals ASU:[id number], regular rhythm ,  Lungs: CTAB  ,  Abd: soft, NT, ND, + BS, Ext warm and well-perfused.  2+ DP/TP and radial pulses.  No edema  .  All available labs, radiology testing, previous records reviewed. Agree with documented assessment and plan.  Nancy Schmidt has symptoms concerning for angina, especially given her risk factors of age, FH, prior tobacco abuse and HL.  Will get exercise Myoview as above.  Patient too bradycardic for beta blockade at this time.   Skeet Latch P  10:50 PM  02/16/2015

## 2015-02-17 ENCOUNTER — Observation Stay (HOSPITAL_COMMUNITY): Payer: Medicare Other

## 2015-02-17 ENCOUNTER — Other Ambulatory Visit: Payer: Self-pay | Admitting: Physician Assistant

## 2015-02-17 ENCOUNTER — Encounter (HOSPITAL_COMMUNITY): Payer: Self-pay | Admitting: *Deleted

## 2015-02-17 DIAGNOSIS — E785 Hyperlipidemia, unspecified: Secondary | ICD-10-CM | POA: Diagnosis not present

## 2015-02-17 DIAGNOSIS — R030 Elevated blood-pressure reading, without diagnosis of hypertension: Secondary | ICD-10-CM

## 2015-02-17 DIAGNOSIS — R079 Chest pain, unspecified: Secondary | ICD-10-CM | POA: Diagnosis not present

## 2015-02-17 DIAGNOSIS — I200 Unstable angina: Secondary | ICD-10-CM | POA: Diagnosis not present

## 2015-02-17 LAB — LIPID PANEL
CHOL/HDL RATIO: 4.5 ratio
Cholesterol: 238 mg/dL — ABNORMAL HIGH (ref 0–200)
HDL: 53 mg/dL (ref >40)
LDL Cholesterol: 148 mg/dL — ABNORMAL HIGH (ref 0–99)
TRIGLYCERIDES: 185 mg/dL — AB (ref <150)
VLDL: 37 mg/dL (ref 0–40)

## 2015-02-17 LAB — COMPREHENSIVE METABOLIC PANEL
ALBUMIN: 3.4 g/dL — AB (ref 3.5–5.0)
ALT: 15 U/L (ref 14–54)
AST: 22 U/L (ref 15–41)
Alkaline Phosphatase: 76 U/L (ref 38–126)
Anion gap: 9 (ref 5–15)
BUN: 14 mg/dL (ref 6–20)
CALCIUM: 9.1 mg/dL (ref 8.9–10.3)
CO2: 29 mmol/L (ref 22–32)
Chloride: 104 mmol/L (ref 101–111)
Creatinine, Ser: 0.77 mg/dL (ref 0.44–1.00)
GFR calc Af Amer: >60 mL/min (ref >60)
GFR calc non Af Amer: >60 mL/min (ref >60)
Glucose, Bld: 91 mg/dL (ref 65–99)
Potassium: 3.8 mmol/L (ref 3.5–5.1)
Sodium: 142 mmol/L (ref 135–145)
Total Bilirubin: 0.8 mg/dL (ref 0.3–1.2)
Total Protein: 6.3 g/dL — ABNORMAL LOW (ref 6.5–8.1)

## 2015-02-17 LAB — BRAIN NATRIURETIC PEPTIDE: B Natriuretic Peptide: 32.3 pg/mL (ref 0.0–100.0)

## 2015-02-17 LAB — TROPONIN I
Troponin I: <0.03 ng/mL (ref <0.031)
Troponin I: <0.03 ng/mL (ref <0.031)

## 2015-02-17 MED ORDER — ZOLPIDEM TARTRATE 5 MG PO TABS: 5.0000 mg | ORAL_TABLET | Freq: Every evening | ORAL | Status: DC | PRN

## 2015-02-17 MED ORDER — ENOXAPARIN SODIUM 40 MG/0.4ML ~~LOC~~ SOLN
40.0000 mg | Freq: Every day | SUBCUTANEOUS | Status: DC
  Administered 2015-02-17: 40 mg via SUBCUTANEOUS
  Filled 2015-02-17 (×2): qty 0.4

## 2015-02-17 MED ORDER — NITROGLYCERIN 0.4 MG SL SUBL: 0.4000 mg | SUBLINGUAL_TABLET | SUBLINGUAL | Status: DC | PRN

## 2015-02-17 MED ORDER — ALPRAZOLAM 0.25 MG PO TABS: 0.2500 mg | ORAL_TABLET | Freq: Two times a day (BID) | ORAL | Status: DC | PRN

## 2015-02-17 MED ORDER — TECHNETIUM TC 99M SESTAMIBI GENERIC - CARDIOLITE
10.0000 | Freq: Once | INTRAVENOUS | Status: AC | PRN
  Administered 2015-02-17: 10 via INTRAVENOUS

## 2015-02-17 MED ORDER — ASPIRIN 300 MG RE SUPP
300.0000 mg | RECTAL | Status: AC
  Filled 2015-02-17: qty 1

## 2015-02-17 MED ORDER — ATORVASTATIN CALCIUM 40 MG PO TABS: 40.0000 mg | ORAL_TABLET | Freq: Every day | ORAL | Status: DC

## 2015-02-17 MED ORDER — ASPIRIN EC 81 MG PO TBEC
81.0000 mg | DELAYED_RELEASE_TABLET | Freq: Every day | ORAL | Status: DC
Start: 2015-02-18 — End: 2015-02-17

## 2015-02-17 MED ORDER — ATORVASTATIN CALCIUM 40 MG PO TABS
40.0000 mg | ORAL_TABLET | Freq: Every day | ORAL | Status: DC
  Filled 2015-02-17: qty 1

## 2015-02-17 MED ORDER — REGADENOSON 0.4 MG/5ML IV SOLN
0.4000 mg | Freq: Once | INTRAVENOUS | Status: DC
  Filled 2015-02-17: qty 5

## 2015-02-17 MED ORDER — TECHNETIUM TC 99M SESTAMIBI GENERIC - CARDIOLITE
30.0000 | Freq: Once | INTRAVENOUS | Status: AC | PRN
  Administered 2015-02-17: 30 via INTRAVENOUS

## 2015-02-17 MED ORDER — ASPIRIN 81 MG PO CHEW
324.0000 mg | CHEWABLE_TABLET | ORAL | Status: AC
  Administered 2015-02-17: 324 mg via ORAL
  Filled 2015-02-17: qty 4

## 2015-02-17 MED ORDER — ONDANSETRON HCL 4 MG/2ML IJ SOLN
4.0000 mg | Freq: Four times a day (QID) | INTRAMUSCULAR | Status: DC | PRN
Start: 2015-02-17 — End: 2015-02-17

## 2015-02-17 MED ORDER — ACETAMINOPHEN 325 MG PO TABS: 650.0000 mg | ORAL_TABLET | ORAL | Status: DC | PRN

## 2015-02-17 MED ORDER — LISINOPRIL 10 MG PO TABS
10.0000 mg | ORAL_TABLET | Freq: Every day | ORAL | Status: DC
  Administered 2015-02-17: 10 mg via ORAL
  Filled 2015-02-17: qty 1

## 2015-02-17 MED ORDER — LISINOPRIL 10 MG PO TABS: 10.0000 mg | ORAL_TABLET | Freq: Every day | ORAL | Status: DC

## 2015-02-17 MED ORDER — REGADENOSON 0.4 MG/5ML IV SOLN
INTRAVENOUS | Status: AC
  Filled 2015-02-17: qty 5

## 2015-02-17 NOTE — Discharge Summary (Signed)
Physician Discharge Summary     Cardiologist:  Oval Linsey  Patient ID: Nancy Schmidt MRN: [id number] DOB/AGE: 11-18-1940 74 y.o.  Admit date: 02/16/2015 Discharge date: 02/17/2015  Admission Diagnoses: Chest pain  Discharge Diagnoses:  Principal Problem:   Chest pain with moderate risk for cardiac etiology Active Problems:   Hyperlipidemia   Elevated blood pressure   Chest pain   Discharged Condition: stable  Hospital Course:   Nancy Schmidt is a 74 y.o. female with a history of HLD (not on current medical therapy) who presented to the ED on 02/16/15 for chest pain. Around 3:00 she was walking her dog up an incline in her neighborhood and suddenly developed chest pain with exertion. She reports having walked this incline daily for several months and has never experienced anything like this before while doing so. She describes the pain as more of a "pressure" and felt as if something were ripping at her chest. She also experienced radiating pain into her left arm, left leg, and the left side of her neck. She reports the pain was 3/10 at its worse. Her husband brought her to the ED, where since then her pain has resolved. Initial troponin was negative.  With this episode, she also noted shortness of breath, which has also resolved since being in the ED. Her husband adds that she has complained about being fatigued over the past month when taking her outdoor walks or walking around the store. She denies any palpitations, nausea, vomiting, syncope, orthopnea, or edema.   She was admitted for observation and ruled out for MI.  She underwent nuclear stress testing was low risk with no reversible ischemia.  EF 70%.  She was hypertensive so lisinopril was started.   May need further titration.  LDL is 148 so she was placed on a statin.  Follow up in the office.  The patient was seen by Dr. Gwenlyn Found who felt she was stable for DC home.   Consults: None  Significant Diagnostic Studies:    Lipid Panel     Component Value Date/Time   CHOL 238* 02/17/2015 0530   TRIG 185* 02/17/2015 0530   HDL 53 02/17/2015 0530   CHOLHDL 4.5 02/17/2015 0530   VLDL 37 02/17/2015 0530   LDLCALC 148* 02/17/2015 0530      EXAM: MYOCARDIAL IMAGING WITH SPECT (REST AND PHARMACOLOGIC-STRESS)  GATED LEFT VENTRICULAR WALL MOTION STUDY  LEFT VENTRICULAR EJECTION FRACTION  TECHNIQUE: Standard myocardial SPECT imaging was performed after resting intravenous injection of 10 mCi Tc-24m sestamibi. Subsequently, intravenous infusion of Lexiscan was performed under the supervision of the Cardiology staff. At peak effect of the drug, 30 mCi Tc-73m sestamibi was injected intravenously and standard myocardial SPECT imaging was performed. Quantitative gated imaging was also performed to evaluate left ventricular wall motion, and estimate left ventricular ejection fraction.  COMPARISON: Chest radiograph 02/16/2015  FINDINGS: Perfusion: No decreased activity in the left ventricle on stress imaging to suggest reversible ischemia or infarction.  Wall Motion: Normal left ventricular wall motion. No left ventricular dilation.  Left Ventricular Ejection Fraction: 70 %  End diastolic volume 43 ml  End systolic volume 13 ml  IMPRESSION: 1. No reversible ischemia or infarction.  2. Normal left ventricular wall motion.  3. Left ventricular ejection fraction is 70%.  4. Low-risk stress test findings*. Treatments:See above  Discharge Exam: Blood pressure 127/54, pulse 65, temperature 98 F (36.7 C), temperature source Oral, resp. rate 18, height 5\' 1"  (1.549 m), weight 133 lb 4.8 oz (  60.464 kg), SpO2 93 %.   Disposition: Final discharge disposition not confirmed      Discharge Instructions    Diet - low sodium heart healthy    Complete by:  As directed             Medication List    TAKE these medications        aspirin EC 81 MG tablet  Take 81 mg by mouth 2  (two) times a week.     atorvastatin 40 MG tablet  Commonly known as:  LIPITOR  Take 1 tablet (40 mg total) by mouth daily at 6 PM.     FLUoxetine 10 MG capsule  Commonly known as:  PROZAC  Take 10 mg by mouth daily.     lisinopril 10 MG tablet  Commonly known as:  PRINIVIL,ZESTRIL  Take 1 tablet (10 mg total) by mouth daily.     multivitamin with minerals Tabs tablet  Take 1 tablet by mouth daily.       Follow-up Information    Follow up with Skeet Latch, MD On 03/22/2015.   Why:  3:00 PM   Contact information:   [address] [place] [address] [phone]     Greater than 30 minutes was spent completing the patient's discharge.   SignedTarri Fuller , [place]  02/17/2015, 4:47 PM

## 2015-02-17 NOTE — Progress Notes (Signed)
Subjective:  No further CP.   Objective:  Temp:  [97.9 F (36.6 C)-98.1 F (36.7 C)] 97.9 F (36.6 C) (08/11 0456) Pulse Rate:  [52-63] 58 (08/11 0456) Resp:  [12-23] 20 (08/11 0456) BP: (138-184)/(55-76) 138/55 mmHg (08/11 0456) SpO2:  [95 %-98 %] 97 % (08/11 0456) Weight:  [133 lb 4.8 oz (60.464 kg)] 133 lb 4.8 oz (60.464 kg) (08/10 2351) Weight change:   Intake/Output from previous day: 08/10 0701 - 08/11 0700 In: -  Out: 200 [Urine:200]  Intake/Output from this shift: Total I/O In: -  Out: 300 [Urine:300]  Physical Exam: General appearance: alert and no distress Neck: no adenopathy, no carotid bruit, no JVD, supple, symmetrical, trachea midline and thyroid not enlarged, symmetric, no tenderness/mass/nodules Lungs: clear to auscultation bilaterally Heart: regular rate and rhythm, S1, S2 normal, no murmur, click, rub or gallop Extremities: extremities normal, atraumatic, no cyanosis or edema  Lab Results: Results for orders placed or performed during the hospital encounter of 02/16/15 (from the past 48 hour(s))  Basic metabolic panel     Status: Abnormal   Collection Time: 02/16/15  3:33 PM  Result Value Ref Range   Sodium 139 135 - 145 mmol/L   Potassium 3.9 3.5 - 5.1 mmol/L   Chloride 105 101 - 111 mmol/L   CO2 23 22 - 32 mmol/L   Glucose, Bld 96 65 - 99 mg/dL   BUN <5 (L) 6 - 20 mg/dL   Creatinine, Ser 0.71 0.44 - 1.00 mg/dL   Calcium 9.1 8.9 - 10.3 mg/dL   GFR calc non Af Amer >60 >60 mL/min   GFR calc Af Amer >60 >60 mL/min    Comment: (NOTE) The eGFR has been calculated using the CKD EPI equation. This calculation has not been validated in all clinical situations. eGFR's persistently <60 mL/min signify possible Chronic Kidney Disease.    Anion gap 11 5 - 15  CBC     Status: None   Collection Time: 02/16/15  3:33 PM  Result Value Ref Range   WBC 7.0 4.0 - 10.5 K/uL   RBC 4.44 3.87 - 5.11 MIL/uL   Hemoglobin 13.5 12.0 - 15.0 g/dL   HCT 40.9  36.0 - 46.0 %   MCV 92.1 78.0 - 100.0 fL   MCH 30.4 26.0 - 34.0 pg   MCHC 33.0 30.0 - 36.0 g/dL   RDW 13.0 11.5 - 15.5 %   Platelets 244 150 - 400 K/uL  I-stat troponin, ED     Status: None   Collection Time: 02/16/15  4:06 PM  Result Value Ref Range   Troponin i, poc 0.00 0.00 - 0.08 ng/mL   Comment 3            Comment: Due to the release kinetics of cTnI, a negative result within the first hours of the onset of symptoms does not rule out myocardial infarction with certainty. If myocardial infarction is still suspected, repeat the test at appropriate intervals.   Troponin I     Status: None   Collection Time: 02/17/15  5:30 AM  Result Value Ref Range   Troponin I <0.03 <0.031 ng/mL    Comment:        NO INDICATION OF MYOCARDIAL INJURY.   Comprehensive metabolic panel     Status: Abnormal   Collection Time: 02/17/15  5:30 AM  Result Value Ref Range   Sodium 142 135 - 145 mmol/L   Potassium 3.8 3.5 - 5.1 mmol/L  Chloride 104 101 - 111 mmol/L   CO2 29 22 - 32 mmol/L   Glucose, Bld 91 65 - 99 mg/dL   BUN 14 6 - 20 mg/dL   Creatinine, Ser 0.77 0.44 - 1.00 mg/dL   Calcium 9.1 8.9 - 10.3 mg/dL   Total Protein 6.3 (L) 6.5 - 8.1 g/dL   Albumin 3.4 (L) 3.5 - 5.0 g/dL   AST 22 15 - 41 U/L   ALT 15 14 - 54 U/L   Alkaline Phosphatase 76 38 - 126 U/L   Total Bilirubin 0.8 0.3 - 1.2 mg/dL   GFR calc non Af Amer >60 >60 mL/min   GFR calc Af Amer >60 >60 mL/min    Comment: (NOTE) The eGFR has been calculated using the CKD EPI equation. This calculation has not been validated in all clinical situations. eGFR's persistently <60 mL/min signify possible Chronic Kidney Disease.    Anion gap 9 5 - 15  Brain natriuretic peptide     Status: None   Collection Time: 02/17/15  5:30 AM  Result Value Ref Range   B Natriuretic Peptide 32.3 0.0 - 100.0 pg/mL  Lipid panel     Status: Abnormal   Collection Time: 02/17/15  5:30 AM  Result Value Ref Range   Cholesterol 238 (H) 0 - 200 mg/dL    Triglycerides 185 (H) <150 mg/dL   HDL 53 >40 mg/dL   Total CHOL/HDL Ratio 4.5 RATIO   VLDL 37 0 - 40 mg/dL   LDL Cholesterol 148 (H) 0 - 99 mg/dL    Comment:        Total Cholesterol/HDL:CHD Risk Coronary Heart Disease Risk Table                     Men   Women  1/2 Average Risk   3.4   3.3  Average Risk       5.0   4.4  2 X Average Risk   9.6   7.1  3 X Average Risk  23.4   11.0        Use the calculated Patient Ratio above and the CHD Risk Table to determine the patient's CHD Risk.        ATP III CLASSIFICATION (LDL):  <100     mg/dL   Optimal  100-129  mg/dL   Near or Above                    Optimal  130-159  mg/dL   Borderline  160-189  mg/dL   High  >190     mg/dL   Very High     Imaging: Imaging results have been reviewed  Assessment/Plan:   1. Principal Problem: 2.   Chest pain with moderate risk for cardiac etiology 3. Active Problems: 4.   Hyperlipidemia 5.   Elevated blood pressure 6.   Chest pain 7.   Time Spent Directly with Patient:  20 minutes  Length of Stay:   No further CP. Enz neg. Exam benign. For myoview this AM. If neg can DC home, If + --->> cath tomorrow.  Quay Burow 02/17/2015, 9:36 AM

## 2015-02-17 NOTE — Progress Notes (Signed)
    Subjective: 1/10 chest tightness otherwise well  Objective: Vital signs in last 24 hours: Temp:  [97.9 F (36.6 C)-98.1 F (36.7 C)] 97.9 F (36.6 C) (08/11 0456) Pulse Rate:  [52-63] 58 (08/11 0456) Resp:  [12-23] 20 (08/11 0456) BP: (138-184)/(55-76) 138/55 mmHg (08/11 0456) SpO2:  [95 %-98 %] 97 % (08/11 0456) Weight:  [133 lb 4.8 oz (60.464 kg)] 133 lb 4.8 oz (60.464 kg) (08/10 2351) Last BM Date: 02/16/15  Intake/Output from previous day: 08/10 0701 - 08/11 0700 In: -  Out: 200 [Urine:200] Intake/Output this shift: Total I/O In: -  Out: 300 [Urine:300]  Medications Scheduled Meds: . [START ON 02/18/2015] aspirin EC  81 mg Oral Daily  . enoxaparin (LOVENOX) injection  40 mg Subcutaneous QHS  . lisinopril  10 mg Oral Daily   Continuous Infusions:  PRN Meds:.acetaminophen, ALPRAZolam, nitroGLYCERIN, ondansetron (ZOFRAN) IV, zolpidem  PE: General appearance: alert, cooperative and no distress Lungs: clear to auscultation bilaterally Heart: regular rate and rhythm, S1, S2 normal, no murmur, click, rub or gallop Extremities: No LEE Pulses: 2+ and symmetric Skin: Warm and dry Neurologic: Grossly normal  Lab Results:   Recent Labs  02/16/15 1533  WBC 7.0  HGB 13.5  HCT 40.9  PLT 244   BMET  Recent Labs  02/16/15 1533 02/17/15 0530  NA 139 142  K 3.9 3.8  CL 105 104  CO2 23 29  GLUCOSE 96 91  BUN <5* 14  CREATININE 0.71 0.77  CALCIUM 9.1 9.1   PT/INR No results for input(s): LABPROT, INR in the last 72 hours. Cholesterol  Recent Labs  02/17/15 0530  CHOL 238*   Lipid Panel     Component Value Date/Time   CHOL 238* 02/17/2015 0530   TRIG 185* 02/17/2015 0530   HDL 53 02/17/2015 0530   CHOLHDL 4.5 02/17/2015 0530   VLDL 37 02/17/2015 0530   LDLCALC 148* 02/17/2015 0530     Cardiac Panel (last 3 results)  Recent Labs  02/17/15 0530  TROPONINI <0.03      Assessment/Plan  1. Chest pain with moderate risk for cardiac  etiology - Known history of HLD and former tobacco use, but not sure of any history of HTN. Previous cardiac work-up 15+ years ago consisting of Echo and Exercise stress test which patient reports were normal. Her current clinical picture is concerning for unstable angina. - Troponin negative x1.  Continue to cycle - Schedule for Myoview .  - ASA 81mg  daily and Lisinopril 10mg  daily. - Hold off on starting BB due to her HR being in the 50's at this time.  2. Hyperlipidemia - Patient not currently taking any medications - LDL 148  Add statin  3. Elevated BP - Her BP has been elevated while in the ED (148/62 - 184/76). Unsure if she carries a diagnosis of HTN or if elevation is due to situational factors. Started on Lisinopril 10mg  once daily.  BP better this morning.  WE likely will need to titrate it up.       Tarri Fuller PA-C 02/17/2015 8:53 AM   Agree with note written by Luisa Dago [facility]  Admitted for CP/ROMI. Enz neg. Exam benign. For MV today  Quay Burow 02/17/2015 10:23 AM

## 2015-02-18 NOTE — Telephone Encounter (Signed)
Rx(s) sent to pharmacy electronically.  

## 2015-02-25 DIAGNOSIS — E785 Hyperlipidemia, unspecified: Secondary | ICD-10-CM | POA: Diagnosis not present

## 2015-02-25 DIAGNOSIS — M899 Disorder of bone, unspecified: Secondary | ICD-10-CM | POA: Diagnosis not present

## 2015-02-25 DIAGNOSIS — F322 Major depressive disorder, single episode, severe without psychotic features: Secondary | ICD-10-CM | POA: Diagnosis not present

## 2015-02-25 DIAGNOSIS — Z79899 Other long term (current) drug therapy: Secondary | ICD-10-CM | POA: Diagnosis not present

## 2015-02-25 DIAGNOSIS — Z0000 Encounter for general adult medical examination without abnormal findings: Secondary | ICD-10-CM | POA: Diagnosis not present

## 2015-02-25 DIAGNOSIS — I10 Essential (primary) hypertension: Secondary | ICD-10-CM | POA: Diagnosis not present

## 2015-02-25 DIAGNOSIS — E039 Hypothyroidism, unspecified: Secondary | ICD-10-CM | POA: Diagnosis not present

## 2015-03-10 DIAGNOSIS — Z1231 Encounter for screening mammogram for malignant neoplasm of breast: Secondary | ICD-10-CM | POA: Diagnosis not present

## 2015-03-16 DIAGNOSIS — Z8262 Family history of osteoporosis: Secondary | ICD-10-CM | POA: Diagnosis not present

## 2015-03-22 ENCOUNTER — Encounter: Payer: Self-pay | Admitting: Cardiovascular Disease

## 2015-03-22 ENCOUNTER — Ambulatory Visit (INDEPENDENT_AMBULATORY_CARE_PROVIDER_SITE_OTHER): Payer: Medicare Other | Admitting: Cardiovascular Disease

## 2015-03-22 VITALS — BP 122/76 | HR 56 | Ht 60.0 in | Wt 134.4 lb

## 2015-03-22 DIAGNOSIS — E785 Hyperlipidemia, unspecified: Secondary | ICD-10-CM

## 2015-03-22 DIAGNOSIS — I200 Unstable angina: Secondary | ICD-10-CM | POA: Diagnosis not present

## 2015-03-22 DIAGNOSIS — I10 Essential (primary) hypertension: Secondary | ICD-10-CM | POA: Diagnosis not present

## 2015-03-22 DIAGNOSIS — R072 Precordial pain: Secondary | ICD-10-CM

## 2015-03-22 NOTE — Patient Instructions (Signed)
Your physician recommends that you schedule a follow-up appointment As Needed  

## 2015-03-22 NOTE — Progress Notes (Signed)
Cardiology Office Note   Date:  03/22/2015   ID:  Nancy Schmidt, DOB June 09, 1941, MRN [id number]  PCP:  No primary care provider on file.  Cardiologist:   Sharol Harness, MD   Chief Complaint  Patient presents with  . Hospitalization Follow-up  . Shortness of Breath    only when climbing stairs or walking up hill      History of Present Illness: Nancy Schmidt is a 74 y.o. female with HL who presents for an evaluation of chest pain.  Nancy Schmidt was evaluated in the ED on 02/16/15 with chest pain.  Cardiac enzymes were negative and Lexiscan Myoview was negative for ischemia.  LVEF was 70%.  Atorvastatin 40mg  was started for hyperlipidemia. Since then she denies any recurrent CP or shortness of breath.  Her only complaint is occasional L arm and leg numbness.  This typically occurs after she has been sitting in an awkward position for prolonged time.  She denies any weakness, tingling, lightheadedness, slurred speech or word-finding difficulty.  Nancy Schmidt does not get much exercise.  She joined the [employer] but has only gone once.  Now that the weather is cooler she has started walking her dogs.  Past Medical History  Diagnosis Date  . Arthritis   . Hyperlipidemia     Past Surgical History  Procedure Laterality Date  . Abdominal hysterectomy       Current Outpatient Prescriptions  Medication Sig Dispense Refill  . aspirin EC 81 MG tablet Take 81 mg by mouth 2 (two) times a week.     Marland Kitchen atorvastatin (LIPITOR) 40 MG tablet TAKE 1 TABLET(40 MG) BY MOUTH DAILY AT 6 PM 90 tablet 0  . FLUoxetine (PROZAC) 10 MG capsule Take 20 mg by mouth daily.     Marland Kitchen lisinopril (PRINIVIL,ZESTRIL) 10 MG tablet TAKE 1 TABLET(10 MG) BY MOUTH DAILY 90 tablet 0  . Multiple Vitamin (MULTIVITAMIN WITH MINERALS) TABS tablet Take 1 tablet by mouth daily.     No current facility-administered medications for this visit.    Allergies:   Other    Social History:  The patient  reports that she  quit smoking about 20 years ago. Her smoking use included Cigarettes. She has a 30 pack-year smoking history. She has never used smokeless tobacco. She reports that she drinks about 8.4 oz of alcohol per week. She reports that she does not use illicit drugs.   Family History:  The patient's family history includes Coronary artery disease in her maternal aunt; Heart attack in her mother; Heart failure in her mother.    ROS:  Please see the history of present illness.   Otherwise, review of systems are positive for none.   All other systems are reviewed and negative.    PHYSICAL EXAM: VS:  BP 122/76 mmHg  Pulse 56  Ht 5' (1.524 m)  Wt 60.963 kg (134 lb 6.4 oz)  BMI 26.25 kg/m2 , BMI Body mass index is 26.25 kg/(m^2). GENERAL:  Well appearing HEENT:  Pupils equal round and reactive, fundi not visualized, oral mucosa unremarkable NECK:  No jugular venous distention, waveform within normal limits, carotid upstroke brisk and symmetric, no bruits, no thyromegaly LYMPHATICS:  No cervical adenopathy LUNGS:  Clear to auscultation bilaterally HEART:  RRR.  PMI not displaced or sustained,S1 and S2 within normal limits, no S3, no S4, no clicks, no rubs, no murmurs ABD:  Flat, positive bowel sounds normal in frequency in pitch, no bruits, no rebound, no  guarding, no midline pulsatile mass, no hepatomegaly, no splenomegaly EXT:  2 plus pulses throughout, no edema, no cyanosis no clubbing SKIN:  No rashes no nodules NEURO:  Cranial nerves II through XII grossly intact, motor grossly intact throughout PSYCH:  Cognitively intact, oriented to person place and time    EKG:  EKG is not ordered today.   Recent Labs: 02/16/2015: Hemoglobin 13.5; Platelets 244 02/17/2015: ALT 15; B Natriuretic Peptide 32.3; BUN 14; Creatinine, Ser 0.77; Potassium 3.8; Sodium 142    Lipid Panel    Component Value Date/Time   CHOL 238* 02/17/2015 0530   TRIG 185* 02/17/2015 0530   HDL 53 02/17/2015 0530   CHOLHDL 4.5  02/17/2015 0530   VLDL 37 02/17/2015 0530   LDLCALC 148* 02/17/2015 0530      Wt Readings from Last 3 Encounters:  03/22/15 60.963 kg (134 lb 6.4 oz)  02/16/15 60.464 kg (133 lb 4.8 oz)      Other studies Reviewed: Additional studies/ records that were reviewed today include: hospital record. Review of the above records demonstrates:  Please see elsewhere in the note.     ASSESSMENT AND PLAN:  # Atypical CP: Symptoms have resolved and stress was negative for ischemia.  Continue primary prevention.  # Hyperlipidemia: Atorvastatin 40mg  started in the hospital.  Her PCP previously recommended that she start atorvastatin but she declined.  She has been taking it without complication.  Ms. Pester will follow up with her PCP in 1-2 months for LFT testing.  # Hypertension: BP well-controlled.  Continue lisinopril.  # CV Disease Prevention: Continue aspirin and atorvastatin.   Current medicines are reviewed at length with the patient today.  The patient does not have concerns regarding medicines.  The following changes have been made:  no change  Labs/ tests ordered today include:  No orders of the defined types were placed in this encounter.     Disposition:   FU with Dr. Jonelle Sidle C. [place] prn.    Signed, Sharol Harness, MD  03/22/2015 3:48 PM    [place]

## 2015-05-20 ENCOUNTER — Other Ambulatory Visit: Payer: Self-pay | Admitting: *Deleted

## 2015-05-20 MED ORDER — LISINOPRIL 10 MG PO TABS: 10.0000 mg | ORAL_TABLET | Freq: Every day | ORAL | Status: DC

## 2015-05-26 ENCOUNTER — Other Ambulatory Visit: Payer: Self-pay

## 2015-05-26 MED ORDER — LISINOPRIL 10 MG PO TABS: 10.0000 mg | ORAL_TABLET | Freq: Every day | ORAL | Status: DC

## 2015-08-08 DIAGNOSIS — L821 Other seborrheic keratosis: Secondary | ICD-10-CM | POA: Diagnosis not present

## 2015-08-08 DIAGNOSIS — L438 Other lichen planus: Secondary | ICD-10-CM | POA: Diagnosis not present

## 2015-10-26 DIAGNOSIS — L718 Other rosacea: Secondary | ICD-10-CM | POA: Diagnosis not present

## 2015-12-28 DIAGNOSIS — H524 Presbyopia: Secondary | ICD-10-CM | POA: Diagnosis not present

## 2015-12-28 DIAGNOSIS — H2513 Age-related nuclear cataract, bilateral: Secondary | ICD-10-CM | POA: Diagnosis not present

## 2015-12-28 DIAGNOSIS — H25013 Cortical age-related cataract, bilateral: Secondary | ICD-10-CM | POA: Diagnosis not present

## 2016-01-05 DIAGNOSIS — H903 Sensorineural hearing loss, bilateral: Secondary | ICD-10-CM | POA: Diagnosis not present

## 2016-01-05 DIAGNOSIS — H6123 Impacted cerumen, bilateral: Secondary | ICD-10-CM | POA: Diagnosis not present

## 2016-01-05 DIAGNOSIS — H61393 Other acquired stenosis of external ear canal, bilateral: Secondary | ICD-10-CM | POA: Diagnosis not present

## 2016-03-23 DIAGNOSIS — Z79899 Other long term (current) drug therapy: Secondary | ICD-10-CM | POA: Diagnosis not present

## 2016-03-23 DIAGNOSIS — E559 Vitamin D deficiency, unspecified: Secondary | ICD-10-CM | POA: Diagnosis not present

## 2016-03-23 DIAGNOSIS — I10 Essential (primary) hypertension: Secondary | ICD-10-CM | POA: Diagnosis not present

## 2016-03-23 DIAGNOSIS — Z0000 Encounter for general adult medical examination without abnormal findings: Secondary | ICD-10-CM | POA: Diagnosis not present

## 2016-03-23 DIAGNOSIS — E785 Hyperlipidemia, unspecified: Secondary | ICD-10-CM | POA: Diagnosis not present

## 2016-03-23 DIAGNOSIS — Z6828 Body mass index (BMI) 28.0-28.9, adult: Secondary | ICD-10-CM | POA: Diagnosis not present

## 2016-03-23 DIAGNOSIS — M810 Age-related osteoporosis without current pathological fracture: Secondary | ICD-10-CM | POA: Diagnosis not present

## 2016-04-04 ENCOUNTER — Other Ambulatory Visit: Payer: Self-pay

## 2016-04-04 MED ORDER — LISINOPRIL 10 MG PO TABS: 10.0000 mg | ORAL_TABLET | Freq: Every day | ORAL | 3 refills | Status: DC

## 2016-06-19 DIAGNOSIS — N3000 Acute cystitis without hematuria: Secondary | ICD-10-CM | POA: Diagnosis not present

## 2016-07-04 ENCOUNTER — Other Ambulatory Visit: Payer: Self-pay

## 2016-07-04 MED ORDER — LISINOPRIL 10 MG PO TABS: 10.0000 mg | ORAL_TABLET | Freq: Every day | ORAL | 1 refills | Status: DC

## 2016-07-27 DIAGNOSIS — L308 Other specified dermatitis: Secondary | ICD-10-CM | POA: Diagnosis not present

## 2016-09-09 IMAGING — DX DG CHEST 2V
2 series · 2 of 2 positions shown · non-contrast
Comparison: 08/15/2005

CLINICAL DATA: Left chest and arm pain.  Symptoms for 3 days.

EXAM:
CHEST  2 VIEW

[chest pa]
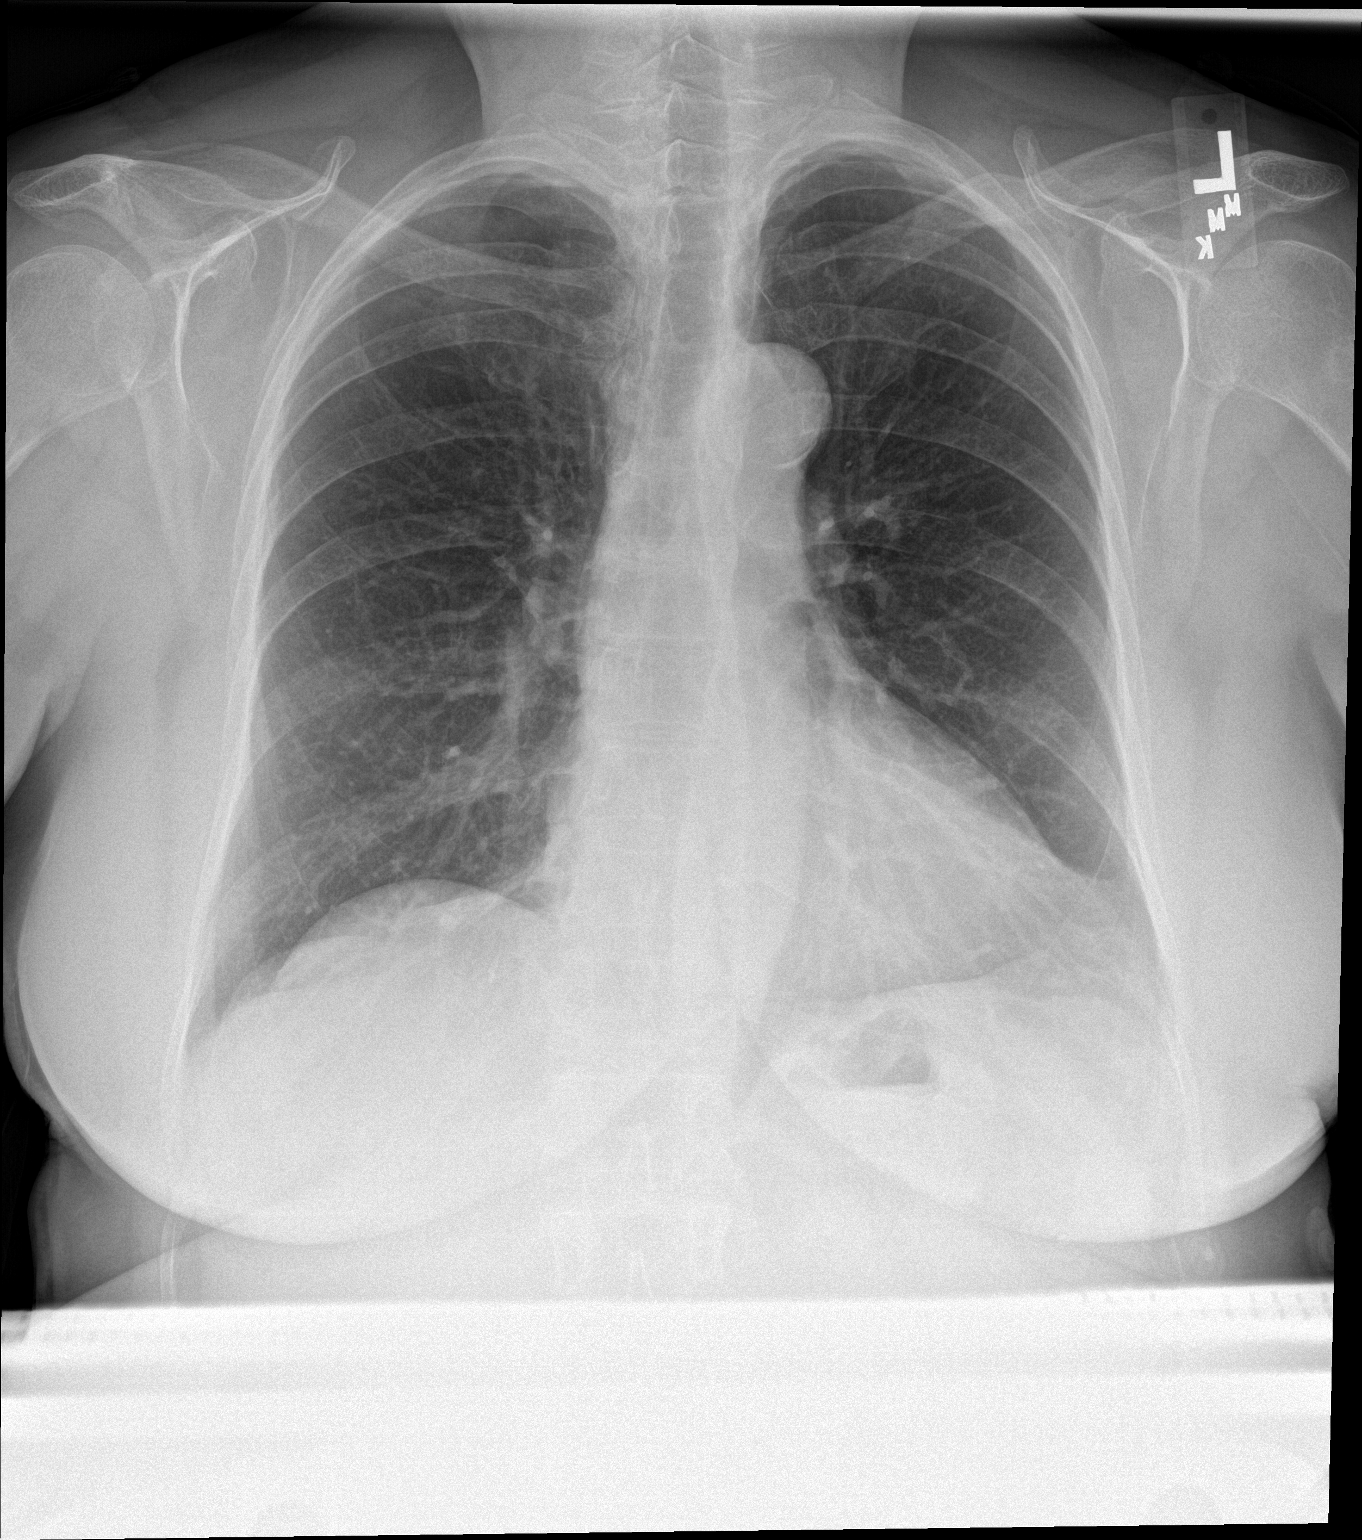

[chest lat]
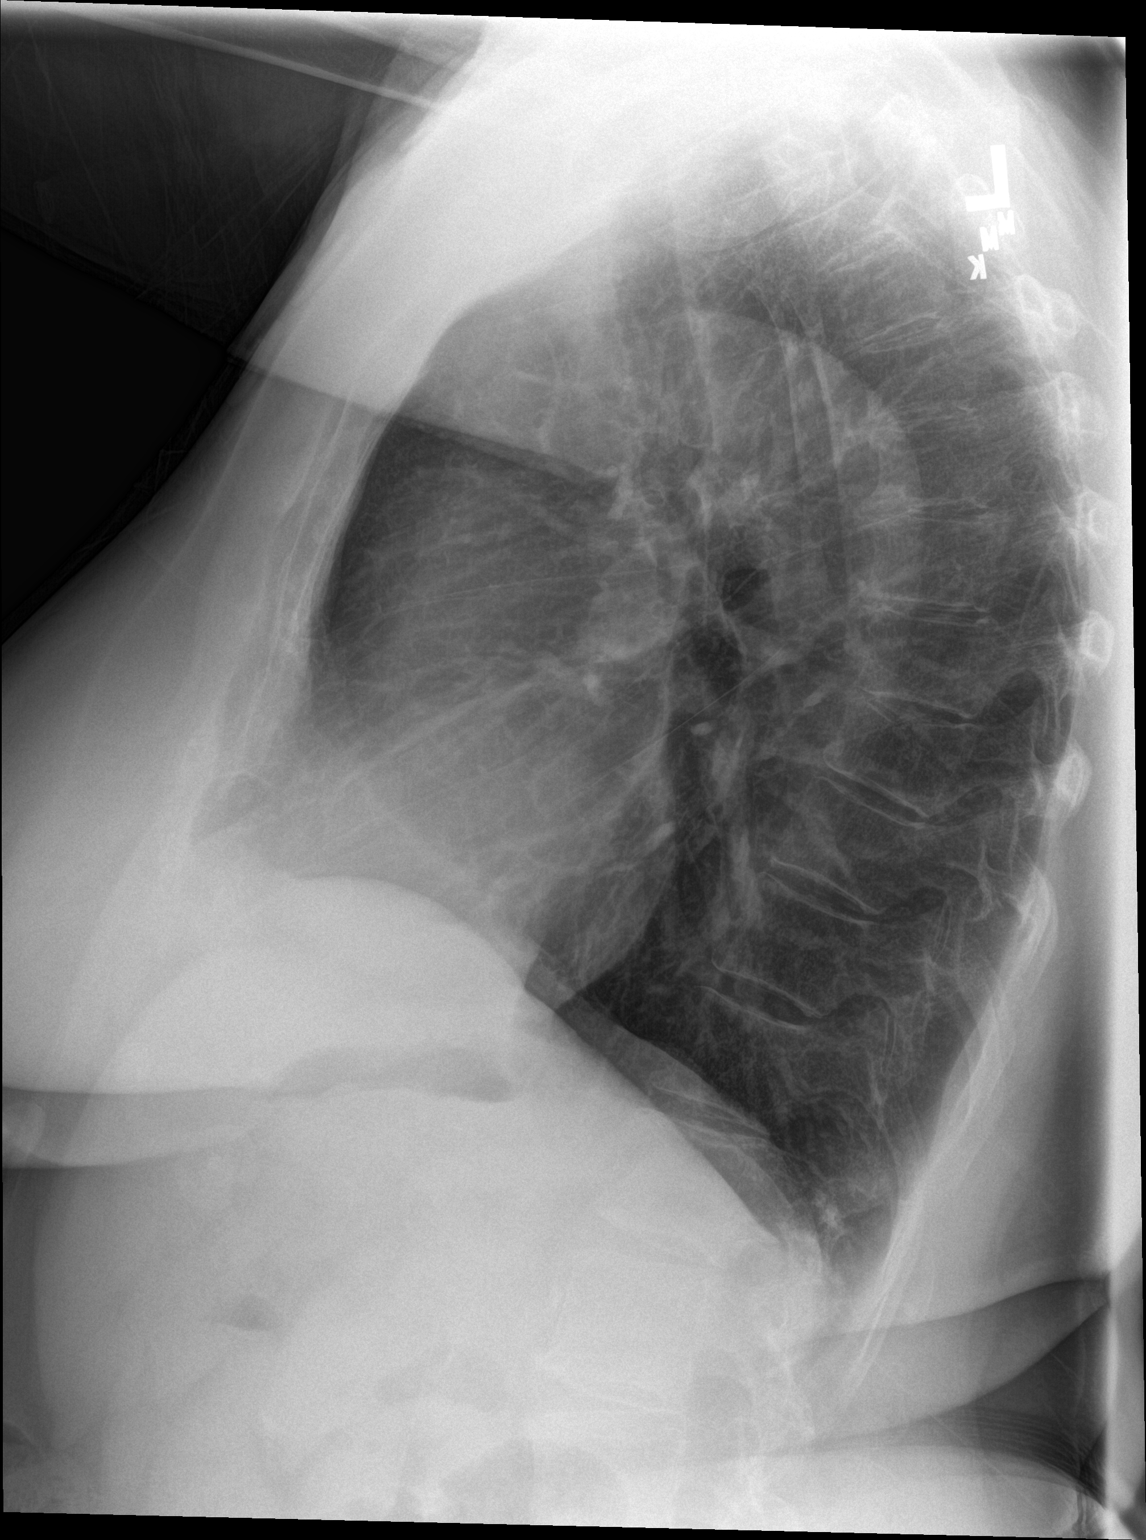

[2 of 2 positions shown; findings below may reference images not displayed]

FINDINGS: Both lungs are clear. Heart and mediastinum are within normal
limits. Calcifications at the aortic arch. Old left clavicle
fracture. No large pleural effusions. Mild degenerative changes in
the thoracic spine.
IMPRESSION: No active cardiopulmonary disease.

## 2016-09-17 DIAGNOSIS — K5730 Diverticulosis of large intestine without perforation or abscess without bleeding: Secondary | ICD-10-CM | POA: Diagnosis not present

## 2016-09-17 DIAGNOSIS — Z86010 Personal history of colonic polyps: Secondary | ICD-10-CM | POA: Diagnosis not present

## 2016-11-25 DIAGNOSIS — J042 Acute laryngotracheitis: Secondary | ICD-10-CM | POA: Diagnosis not present

## 2016-11-26 DIAGNOSIS — J042 Acute laryngotracheitis: Secondary | ICD-10-CM | POA: Diagnosis not present

## 2016-11-27 DIAGNOSIS — J042 Acute laryngotracheitis: Secondary | ICD-10-CM | POA: Diagnosis not present

## 2016-12-17 DIAGNOSIS — J069 Acute upper respiratory infection, unspecified: Secondary | ICD-10-CM | POA: Diagnosis not present

## 2016-12-26 ENCOUNTER — Other Ambulatory Visit: Payer: Self-pay | Admitting: Cardiovascular Disease

## 2016-12-26 NOTE — Telephone Encounter (Signed)
Please review for refill, thanks ! 

## 2017-02-21 DIAGNOSIS — R209 Unspecified disturbances of skin sensation: Secondary | ICD-10-CM | POA: Diagnosis not present

## 2017-02-21 DIAGNOSIS — R05 Cough: Secondary | ICD-10-CM | POA: Diagnosis not present

## 2017-02-21 DIAGNOSIS — R42 Dizziness and giddiness: Secondary | ICD-10-CM | POA: Diagnosis not present

## 2017-04-11 ENCOUNTER — Other Ambulatory Visit: Payer: Self-pay | Admitting: Cardiovascular Disease

## 2017-04-11 NOTE — Telephone Encounter (Signed)
Please review for refill. Thanks!  

## 2017-04-12 ENCOUNTER — Other Ambulatory Visit: Payer: Self-pay | Admitting: Cardiovascular Disease

## 2017-04-12 NOTE — Telephone Encounter (Signed)
Refill refused. Sees Dr. Oval Linsey PRN - last visit 03/2015

## 2017-04-12 NOTE — Telephone Encounter (Signed)
Please review for refill, Thanks !  

## 2017-04-19 DIAGNOSIS — Z79899 Other long term (current) drug therapy: Secondary | ICD-10-CM | POA: Diagnosis not present

## 2017-04-19 DIAGNOSIS — M810 Age-related osteoporosis without current pathological fracture: Secondary | ICD-10-CM | POA: Diagnosis not present

## 2017-04-19 DIAGNOSIS — I10 Essential (primary) hypertension: Secondary | ICD-10-CM | POA: Diagnosis not present

## 2017-04-19 DIAGNOSIS — Z136 Encounter for screening for cardiovascular disorders: Secondary | ICD-10-CM | POA: Diagnosis not present

## 2017-04-19 DIAGNOSIS — E785 Hyperlipidemia, unspecified: Secondary | ICD-10-CM | POA: Diagnosis not present

## 2017-04-19 DIAGNOSIS — Z23 Encounter for immunization: Secondary | ICD-10-CM | POA: Diagnosis not present

## 2017-04-19 DIAGNOSIS — E039 Hypothyroidism, unspecified: Secondary | ICD-10-CM | POA: Diagnosis not present

## 2017-04-19 DIAGNOSIS — Z0000 Encounter for general adult medical examination without abnormal findings: Secondary | ICD-10-CM | POA: Diagnosis not present

## 2017-04-19 DIAGNOSIS — E559 Vitamin D deficiency, unspecified: Secondary | ICD-10-CM | POA: Diagnosis not present

## 2017-04-19 DIAGNOSIS — Z6826 Body mass index (BMI) 26.0-26.9, adult: Secondary | ICD-10-CM | POA: Diagnosis not present

## 2017-04-19 DIAGNOSIS — F322 Major depressive disorder, single episode, severe without psychotic features: Secondary | ICD-10-CM | POA: Diagnosis not present

## 2017-05-09 ENCOUNTER — Other Ambulatory Visit: Payer: Self-pay | Admitting: Cardiovascular Disease

## 2017-05-09 NOTE — Telephone Encounter (Signed)
Refill Request.  

## 2017-06-03 ENCOUNTER — Other Ambulatory Visit: Payer: Self-pay | Admitting: Cardiovascular Disease

## 2017-06-04 ENCOUNTER — Other Ambulatory Visit: Payer: Self-pay | Admitting: Cardiovascular Disease

## 2017-06-04 NOTE — Telephone Encounter (Signed)
REFILL 

## 2017-06-04 NOTE — Telephone Encounter (Signed)
Please review for refill, Thanks !  

## 2017-06-04 NOTE — Telephone Encounter (Signed)
Refill Request.  

## 2017-09-19 DIAGNOSIS — H61393 Other acquired stenosis of external ear canal, bilateral: Secondary | ICD-10-CM | POA: Diagnosis not present

## 2017-09-19 DIAGNOSIS — H6123 Impacted cerumen, bilateral: Secondary | ICD-10-CM | POA: Diagnosis not present

## 2017-09-19 DIAGNOSIS — H903 Sensorineural hearing loss, bilateral: Secondary | ICD-10-CM | POA: Diagnosis not present

## 2017-10-05 ENCOUNTER — Other Ambulatory Visit: Payer: Self-pay | Admitting: Cardiovascular Disease

## 2017-10-07 NOTE — Telephone Encounter (Signed)
Refill Request.  

## 2018-01-30 DIAGNOSIS — R42 Dizziness and giddiness: Secondary | ICD-10-CM | POA: Diagnosis not present

## 2018-02-27 DIAGNOSIS — D224 Melanocytic nevi of scalp and neck: Secondary | ICD-10-CM | POA: Diagnosis not present

## 2018-03-26 DIAGNOSIS — D0461 Carcinoma in situ of skin of right upper limb, including shoulder: Secondary | ICD-10-CM | POA: Diagnosis not present

## 2018-03-26 DIAGNOSIS — D485 Neoplasm of uncertain behavior of skin: Secondary | ICD-10-CM | POA: Diagnosis not present

## 2018-03-26 DIAGNOSIS — L821 Other seborrheic keratosis: Secondary | ICD-10-CM | POA: Diagnosis not present

## 2018-04-16 DIAGNOSIS — J069 Acute upper respiratory infection, unspecified: Secondary | ICD-10-CM | POA: Diagnosis not present

## 2018-04-29 DIAGNOSIS — H6093 Unspecified otitis externa, bilateral: Secondary | ICD-10-CM | POA: Diagnosis not present

## 2018-04-29 DIAGNOSIS — H6123 Impacted cerumen, bilateral: Secondary | ICD-10-CM | POA: Diagnosis not present

## 2018-04-29 DIAGNOSIS — H903 Sensorineural hearing loss, bilateral: Secondary | ICD-10-CM | POA: Diagnosis not present

## 2018-04-29 DIAGNOSIS — H61393 Other acquired stenosis of external ear canal, bilateral: Secondary | ICD-10-CM | POA: Diagnosis not present

## 2018-05-15 DIAGNOSIS — Z79899 Other long term (current) drug therapy: Secondary | ICD-10-CM | POA: Diagnosis not present

## 2018-05-15 DIAGNOSIS — F322 Major depressive disorder, single episode, severe without psychotic features: Secondary | ICD-10-CM | POA: Diagnosis not present

## 2018-05-15 DIAGNOSIS — Z0000 Encounter for general adult medical examination without abnormal findings: Secondary | ICD-10-CM | POA: Diagnosis not present

## 2018-05-15 DIAGNOSIS — Z23 Encounter for immunization: Secondary | ICD-10-CM | POA: Diagnosis not present

## 2018-05-15 DIAGNOSIS — I129 Hypertensive chronic kidney disease with stage 1 through stage 4 chronic kidney disease, or unspecified chronic kidney disease: Secondary | ICD-10-CM | POA: Diagnosis not present

## 2018-05-15 DIAGNOSIS — E559 Vitamin D deficiency, unspecified: Secondary | ICD-10-CM | POA: Diagnosis not present

## 2018-05-15 DIAGNOSIS — R42 Dizziness and giddiness: Secondary | ICD-10-CM | POA: Diagnosis not present

## 2018-05-15 DIAGNOSIS — N182 Chronic kidney disease, stage 2 (mild): Secondary | ICD-10-CM | POA: Diagnosis not present

## 2018-05-15 DIAGNOSIS — M79602 Pain in left arm: Secondary | ICD-10-CM | POA: Diagnosis not present

## 2018-05-15 DIAGNOSIS — M810 Age-related osteoporosis without current pathological fracture: Secondary | ICD-10-CM | POA: Diagnosis not present

## 2018-05-15 DIAGNOSIS — E785 Hyperlipidemia, unspecified: Secondary | ICD-10-CM | POA: Diagnosis not present

## 2018-05-15 DIAGNOSIS — E039 Hypothyroidism, unspecified: Secondary | ICD-10-CM | POA: Diagnosis not present

## 2018-05-29 ENCOUNTER — Encounter: Payer: Self-pay | Admitting: *Deleted

## 2018-06-10 ENCOUNTER — Ambulatory Visit: Payer: Medicare Other | Admitting: Cardiovascular Disease

## 2018-07-16 ENCOUNTER — Encounter: Payer: Self-pay | Admitting: Cardiovascular Disease

## 2018-07-16 ENCOUNTER — Ambulatory Visit (INDEPENDENT_AMBULATORY_CARE_PROVIDER_SITE_OTHER): Payer: Medicare Other | Admitting: Cardiovascular Disease

## 2018-07-16 ENCOUNTER — Other Ambulatory Visit: Payer: Self-pay | Admitting: Cardiovascular Disease

## 2018-07-16 VITALS — BP 138/64 | HR 67 | Ht 60.0 in | Wt 142.2 lb

## 2018-07-16 DIAGNOSIS — Z01812 Encounter for preprocedural laboratory examination: Secondary | ICD-10-CM | POA: Diagnosis not present

## 2018-07-16 DIAGNOSIS — I10 Essential (primary) hypertension: Secondary | ICD-10-CM

## 2018-07-16 DIAGNOSIS — I739 Peripheral vascular disease, unspecified: Secondary | ICD-10-CM | POA: Diagnosis not present

## 2018-07-16 DIAGNOSIS — R200 Anesthesia of skin: Secondary | ICD-10-CM | POA: Diagnosis not present

## 2018-07-16 DIAGNOSIS — R072 Precordial pain: Secondary | ICD-10-CM | POA: Diagnosis not present

## 2018-07-16 HISTORY — DX: Anesthesia of skin: R20.0

## 2018-07-16 HISTORY — DX: Essential (primary) hypertension: I10

## 2018-07-16 MED ORDER — METOPROLOL TARTRATE 50 MG PO TABS: ORAL_TABLET | ORAL | 0 refills | Status: DC

## 2018-07-16 NOTE — Patient Instructions (Addendum)
Medication Instructions:  TAKE METOPROLOL 50 MG 2 HOURS PRIOR TO CT  If you need a refill on your cardiac medications before your next appointment, please call your pharmacy.   Lab work: BMET 1 WEEK PRIOR TO CT  If you have labs (blood work) drawn today and your tests are completely normal, you will receive your results only by: Marland Kitchen MyChart Message (if you have MyChart) OR . A paper copy in the mail If you have any lab test that is abnormal or we need to change your treatment, we will call you to review the results.  Testing/Procedures: Your physician has requested that you have an ankle brachial index (ABI). During this test an ultrasound and blood pressure cuff are used to evaluate the arteries that supply the arms and legs with blood. Allow thirty minutes for this exam. There are no restrictions or special instructions.  Your physician has requested that you have cardiac CT. Cardiac computed tomography (CT) is a painless test that uses an x-ray machine to take clear, detailed pictures of your heart. For further information please visit [employer]. Please follow instruction sheet as given. THE OFFICE WILL CALL YOU TO SCHEDULE ONCE THIS HAS BEEN APPROVED BY YOUR INSURANCE IF YOU DO NOT HEAR FROM [place] IN 3 WEEKS CALL [phone]    Follow-Up: At [facility], you and your health needs are our priority.  As part of our continuing mission to provide you with exceptional heart care, we have created designated Provider Care Teams.  These Care Teams include your primary Cardiologist (physician) and Advanced Practice Providers (APPs -  Physician Assistants and Nurse Practitioners) who all work together to provide you with the care you need, when you need it. You will need a follow up appointment in 2 months.  You may see DR [facility] or one of the following Advanced Practice Providers on your designated Care Team:   Kerin Ransom, PA-C Roby Lofts, [place] . Sande Rives, PA-C  Any  Other Special Instructions Will Be Listed Below (If Applicable).  Please arrive at the [facility] main entrance of [facility] at xx:xx AM (30-45 minutes prior to test start time)  [facility] [place], [place] [address] [phone]  Proceed to the [facility] Radiology Department (First Floor).  Please follow these instructions carefully (unless otherwise directed):  Hold all erectile dysfunction medications at least 48 hours prior to test.  On the Night Before the Test: . Be sure to Drink plenty of water. . Do not consume any caffeinated/decaffeinated beverages or chocolate 12 hours prior to your test. . Do not take any antihistamines 12 hours prior to your test. . If you take Metformin do not take 24 hours prior to test. . If the patient has contrast allergy: ? Patient will need a prescription for Prednisone and very clear instructions (as follows): 1. Prednisone 50 mg - take 13 hours prior to test 2. Take another Prednisone 50 mg 7 hours prior to test 3. Take another Prednisone 50 mg 1 hour prior to test 4. Take Benadryl 50 mg 1 hour prior to test . Patient must complete all four doses of above prophylactic medications. . Patient will need a ride after test due to Benadryl.  On the Day of the Test: . Drink plenty of water. Do not drink any water within one hour of the test. . Do not eat any food 4 hours prior to the test. . You may take your regular medications prior to  the test.  . Take metoprolol (Lopressor) two hours prior to test. . HOLD Furosemide/Hydrochlorothiazide morning of the test.   Do not give Lopressor to patients with an allergy to lopressor or anyone with asthma or active COPD symptoms (currently taking steroids).      After the Test: . Drink plenty of water. . After receiving IV contrast, you may experience a mild flushed feeling. This is normal. . On occasion, you may experience a mild rash up to 24 hours after the  test. This is not dangerous. If this occurs, you can take Benadryl 25 mg and increase your fluid intake. . If you experience trouble breathing, this can be serious. If it is severe call 911 IMMEDIATELY. If it is mild, please call our office. . If you take any of these medications: Glipizide/Metformin, Avandament, Glucavance, please do not take 48 hours after completing test  Cardiac CT Angiogram  A cardiac CT angiogram is a procedure to look at the heart and the area around the heart. It may be done to help find the cause of chest pains or other symptoms of heart disease. During this procedure, a large X-ray machine, called a CT scanner, takes detailed pictures of the heart and the surrounding area after a dye (contrast material) has been injected into blood vessels in the area. The procedure is also sometimes called a coronary CT angiogram, coronary artery scanning, or CTA. A cardiac CT angiogram allows the health care provider to see how well blood is flowing to and from the heart. The health care provider will be able to see if there are any problems, such as:  Blockage or narrowing of the coronary arteries in the heart.  Fluid around the heart.  Signs of weakness or disease in the muscles, valves, and tissues of the heart. Tell a health care provider about:  Any allergies you have. This is especially important if you have had a previous allergic reaction to contrast dye.  All medicines you are taking, including vitamins, herbs, eye drops, creams, and over-the-counter medicines.  Any blood disorders you have.  Any surgeries you have had.  Any medical conditions you have.  Whether you are pregnant or may be pregnant.  Any anxiety disorders, chronic pain, or other conditions you have that may increase your stress or prevent you from lying still. What are the risks? Generally, this is a safe procedure. However, problems may occur, including:  Bleeding.  Infection.  Allergic  reactions to medicines or dyes.  Damage to other structures or organs.  Kidney damage from the dye or contrast that is used.  Increased risk of cancer from radiation exposure. This risk is low. Talk with your health care provider about: ? The risks and benefits of testing. ? How you can receive the lowest dose of radiation. What happens before the procedure?  Wear comfortable clothing and remove any jewelry, glasses, dentures, and hearing aids.  Follow instructions from your health care provider about eating and drinking. This may include: ? For 12 hours before the test - avoid caffeine. This includes tea, coffee, soda, energy drinks, and diet pills. Drink plenty of water or other fluids that do not have caffeine in them. Being well-hydrated can prevent complications. ? For 4-6 hours before the test - stop eating and drinking. The contrast dye can cause nausea, but this is less likely if your stomach is empty.  Ask your health care provider about changing or stopping your regular medicines. This is especially important if you  are taking diabetes medicines, blood thinners, or medicines to treat erectile dysfunction. What happens during the procedure?  Hair on your chest may need to be removed so that small sticky patches called electrodes can be placed on your chest. These will transmit information that helps to monitor your heart during the test.  An IV tube will be inserted into one of your veins.  You might be given a medicine to control your heart rate during the test. This will help to ensure that good images are obtained.  You will be asked to lie on an exam table. This table will slide in and out of the CT machine during the procedure.  Contrast dye will be injected into the IV tube. You might feel warm, or you may get a metallic taste in your mouth.  You will be given a medicine (nitroglycerin) to relax (dilate) the arteries in your heart.  The table that you are lying on will  move into the CT machine tunnel for the scan.  The person running the machine will give you instructions while the scans are being done. You may be asked to: ? Keep your arms above your head. ? Hold your breath. ? Stay very still, even if the table is moving.  When the scanning is complete, you will be moved out of the machine.  The IV tube will be removed. The procedure may vary among health care providers and hospitals. What happens after the procedure?  You might feel warm, or you may get a metallic taste in your mouth from the contrast dye.  You may have a headache from the nitroglycerin.  After the procedure, drink water or other fluids to wash (flush) the contrast material out of your body.  Contact a health care provider if you have any symptoms of allergy to the contrast. These symptoms include: ? Shortness of breath. ? Rash or hives. ? A racing heartbeat.  Most people can return to their normal activities right after the procedure. Ask your health care provider what activities are safe for you.  It is up to you to get the results of your procedure. Ask your health care provider, or the department that is doing the procedure, when your results will be ready. Summary  A cardiac CT angiogram is a procedure to look at the heart and the area around the heart. It may be done to help find the cause of chest pains or other symptoms of heart disease.  During this procedure, a large X-ray machine, called a CT scanner, takes detailed pictures of the heart and the surrounding area after a dye (contrast material) has been injected into blood vessels in the area.  Ask your health care provider about changing or stopping your regular medicines before the procedure. This is especially important if you are taking diabetes medicines, blood thinners, or medicines to treat erectile dysfunction.  After the procedure, drink water or other fluids to wash (flush) the contrast material out of your  body. This information is not intended to replace advice given to you by your health care provider. Make sure you discuss any questions you have with your health care provider. Document Released: 06/07/2008 Document Revised: 05/14/2016 Document Reviewed: 05/14/2016 Elsevier Interactive Patient Education  2019 [employer].

## 2018-07-16 NOTE — Progress Notes (Signed)
Cardiology Office Note   Date:  07/16/2018   ID:  ELDENA DEDE, DOB 1940/11/25, MRN [id number]  PCP:  No primary care provider on file.  Cardiologist:   Skeet Latch, MD   Chief Complaint  Patient presents with  . New Patient (Initial Visit)      History of Present Illness: Nancy Schmidt is a 78 y.o. female with HL who presents for follow-up.  Ms. Spark was initially seen 02/2015 for the evaluation of chest pain.  Ms. Sia was evaluated in the ED on 02/16/15 with chest pain.  Cardiac enzymes were negative and Lexiscan Myoview was negative for ischemia.  LVEF was 70%.  Atorvastatin 40mg  was started for hyperlipidemia.  She followed up in clinic the following month and was doing well.  That time she was instructed to follow-up as needed.  She recently saw her PCP and noted some left arm numbness.  It is sometimes positional.  She notes it when raising her arm or when sleeping at night.  This is also sometimes associated with left facial numbness.  She has not had it with exertion.  She denies any chest pain but does get short of breath with exertion.  Of note, she does report a prior left clavicle injury.  At the time she was told that it was very near her brachial plexus.  She notes that her legs feel tired heavy when she walks.  This is been ongoing for some time but seems to be worse lately.  It improves with rest.  She has no lower extremity edema, orthopnea, or PND.  She checks her blood pressure at home and it typically is in the 120s.  Rarely it is as high as 329 systolic.  She is concerned because her husband recently had a coronary blockage.  Since she was last seen in clinic Ms. Rosier had an episode of severe vertigo.  This has mostly resolved.  She does still get some dizziness when she turns her head side to side or raises it up and down quickly.  She denies dizziness with changes in positions.  Since her appointment in 2016 she stopped taking atorvastatin.  She did  not have any particular adverse side effects but felt that her cholesterol was elevated mostly due to diet.  She wanted to work on diet instead of medication.  Past Medical History:  Diagnosis Date  . Arthritis   . Depression   . Essential hypertension 07/16/2018  . Hyperlipidemia   . Hypertension   . Left arm numbness 07/16/2018  . Osteopenia   . Vitamin D deficiency     Past Surgical History:  Procedure Laterality Date  . ABDOMINAL HYSTERECTOMY       Current Outpatient Medications  Medication Sig Dispense Refill  . aspirin EC 81 MG tablet Take 81 mg by mouth 2 (two) times a week.     Marland Kitchen FLUoxetine (PROZAC) 10 MG capsule Take 20 mg by mouth daily.     Marland Kitchen lisinopril (PRINIVIL,ZESTRIL) 10 MG tablet Take 10 mg by mouth daily.    . Multiple Vitamin (MULTIVITAMIN WITH MINERALS) TABS tablet Take 1 tablet by mouth daily.    . metoprolol tartrate (LOPRESSOR) 50 MG tablet TAKE 1 TABLET BY MOUTH 2 HOURS PRIOR TO CT 1 tablet 0   No current facility-administered medications for this visit.     Allergies:   Other    Social History:  The patient  reports that she quit smoking about 23 years ago.  Her smoking use included cigarettes. She has a 30.00 pack-year smoking history. She has never used smokeless tobacco. She reports current alcohol use of about 14.0 standard drinks of alcohol per week. She reports that she does not use drugs.   Family History:  The patient's family history includes CAD in her mother; Coronary artery disease in her maternal aunt; Heart attack in her mother; Heart failure in her mother; Liver disease in her father.    ROS:  Please see the history of present illness.   Otherwise, review of systems are positive for none.   All other systems are reviewed and negative.    PHYSICAL EXAM: VS:  BP 138/64   Pulse 67   Ht 5' (1.524 m)   Wt 142 lb 3.2 oz (64.5 kg)   BMI 27.77 kg/m  , BMI Body mass index is 27.77 kg/m. GENERAL:  Well appearing HEENT: Pupils equal round and  reactive, fundi not visualized, oral mucosa unremarkable NECK:  No jugular venous distention, waveform within normal limits, carotid upstroke brisk and symmetric, no bruits LUNGS:  Clear to auscultation bilaterally HEART:  RRR.  PMI not displaced or sustained,S1 and S2 within normal limits, no S3, no S4, no clicks, no rubs, no murmurs ABD:  Flat, positive bowel sounds normal in frequency in pitch, no bruits, no rebound, no guarding, no midline pulsatile mass, no hepatomegaly, no splenomegaly EXT:  2+DP.  1+ TP pulses bilaterally.  no edema, no cyanosis no clubbing SKIN:  No rashes no nodules NEURO:  Cranial nerves II through XII grossly intact, motor grossly intact throughout PSYCH:  Cognitively intact, oriented to person place and time    EKG:  EKG is ordered today. 07/16/17: Sinus rhythm.  Rate 67 bpm.    Recent Labs: No results found for requested labs within last 8760 hours.    Lipid Panel    Component Value Date/Time   CHOL 238 (H) 02/17/2015 0530   TRIG 185 (H) 02/17/2015 0530   HDL 53 02/17/2015 0530   CHOLHDL 4.5 02/17/2015 0530   VLDL 37 02/17/2015 0530   LDLCALC 148 (H) 02/17/2015 0530   05/15/2018: Total cholesterol 248, triglycerides 190, HDL 63, LDL 147  Wt Readings from Last 3 Encounters:  07/16/18 142 lb 3.2 oz (64.5 kg)  03/22/15 134 lb 6.4 oz (61 kg)  02/16/15 133 lb 4.8 oz (60.5 kg)      Other studies Reviewed: Additional studies/ records that were reviewed today include: hospital record. Review of the above records demonstrates:  Please see elsewhere in the note.     ASSESSMENT AND PLAN:  # Atypical CP:  # L arm pain: Ms. Gainer's symptoms seem more neurologic than cardiac.  It seems to occur mostly with position and less with exertion.  She does have exertional dyspnea.  She had a negative stress test 3 years ago.  We will get a coronary CT-a to better assess for obstructive coronary disease.  If this is negative I recommend that she see neurology or  orthopedics.   # Hyperlipidemia:  Ms. Reiger was previously on atorvastatin and she discontinued it because she wanted to work on diet.  Her ASCVD ten-year risk is 27.6%.  She should certainly be on a statin.  We are getting a coronary CT-a as above.  We will discuss this at her follow-up.  # Hypertension: BP is elevated here but she reports it is well-controlled at home.  I have asked her to bring recordings to follow-up.  Continue lisinopril for  now.  # LE discomfort:  TP pulses are diminished.  Check ABIs.   Current medicines are reviewed at length with the patient today.  The patient does not have concerns regarding medicines.  The following changes have been made:  no change  Labs/ tests ordered today include:   Orders Placed This Encounter  Procedures  . CT CORONARY MORPH W/CTA COR W/SCORE W/CA W/CM &/OR WO/CM  . CT CORONARY FRACTIONAL FLOW RESERVE DATA PREP  . CT CORONARY FRACTIONAL FLOW RESERVE FLUID ANALYSIS  . Basic metabolic panel  . EKG 12-Lead     Disposition:   FU with Dr. Jonelle Sidle C. Moodus in 2 months.    Signed, Skeet Latch, MD  07/16/2018 1:05 PM    [facility]

## 2018-07-17 ENCOUNTER — Other Ambulatory Visit: Payer: Self-pay | Admitting: Cardiovascular Disease

## 2018-07-18 ENCOUNTER — Other Ambulatory Visit: Payer: Self-pay | Admitting: Cardiovascular Disease

## 2018-07-18 DIAGNOSIS — E039 Hypothyroidism, unspecified: Secondary | ICD-10-CM | POA: Diagnosis not present

## 2018-07-18 NOTE — Telephone Encounter (Signed)
Spoke with pharmacy regarding refill request for Metoprolol 90 days supply. Advised this is a one time dose 2 hours prior to CT, verbalized understanding

## 2018-07-25 ENCOUNTER — Other Ambulatory Visit: Payer: Self-pay | Admitting: Cardiovascular Disease

## 2018-08-01 ENCOUNTER — Ambulatory Visit (HOSPITAL_COMMUNITY)
Admission: RE | Admit: 2018-08-01 | Discharge: 2018-08-01 | Disposition: A | Discharge status: Home / Self Care | Payer: Medicare Other | Source: Ambulatory Visit | Attending: Cardiovascular Disease | Admitting: Cardiovascular Disease

## 2018-08-01 ENCOUNTER — Encounter (HOSPITAL_COMMUNITY): Payer: Medicare Other

## 2018-08-01 DIAGNOSIS — I739 Peripheral vascular disease, unspecified: Secondary | ICD-10-CM

## 2018-08-08 ENCOUNTER — Telehealth (HOSPITAL_COMMUNITY): Payer: Self-pay | Admitting: Emergency Medicine

## 2018-08-08 NOTE — Telephone Encounter (Signed)
Left message on voicemail with name and callback number Esparanza Krider RN Navigator Cardiac Imaging [facility] [phone] Office [phone] Cell  

## 2018-08-08 NOTE — Telephone Encounter (Signed)
Pt returning phone call. pt verbalizes understanding of appt date/time, parking situation and where to check in, pre-test NPO status and medications ordered, and verified current allergies; name and call back number provided for further questions should they arise Marchia Bond RN Navigator Cardiac Imaging [facility] [phone] office [phone] cell

## 2018-08-11 ENCOUNTER — Ambulatory Visit (HOSPITAL_COMMUNITY): Admission: RE | Admit: 2018-08-11 | Payer: Medicare Other | Source: Ambulatory Visit

## 2018-08-11 ENCOUNTER — Ambulatory Visit (HOSPITAL_COMMUNITY)
Admission: RE | Admit: 2018-08-11 | Discharge: 2018-08-11 | Disposition: A | Discharge status: Home / Self Care | Payer: Medicare Other | Source: Ambulatory Visit | Attending: Cardiovascular Disease | Admitting: Cardiovascular Disease

## 2018-08-11 ENCOUNTER — Encounter (HOSPITAL_COMMUNITY): Payer: Self-pay

## 2018-08-11 DIAGNOSIS — R072 Precordial pain: Secondary | ICD-10-CM | POA: Insufficient documentation

## 2018-08-11 LAB — POCT I-STAT CREATININE: Creatinine, Ser: 0.8 mg/dL (ref 0.44–1.00)

## 2018-08-11 MED ORDER — NITROGLYCERIN 0.4 MG SL SUBL
SUBLINGUAL_TABLET | SUBLINGUAL | Status: AC
  Filled 2018-08-11: qty 2

## 2018-08-11 MED ORDER — NITROGLYCERIN 0.4 MG SL SUBL
0.8000 mg | SUBLINGUAL_TABLET | SUBLINGUAL | Status: DC | PRN
  Administered 2018-08-11: 0.8 mg via SUBLINGUAL
  Filled 2018-08-11: qty 25

## 2018-08-11 MED ORDER — IOPAMIDOL (ISOVUE-370) INJECTION 76%
80.0000 mL | Freq: Once | INTRAVENOUS | Status: AC | PRN
  Administered 2018-08-11: 100 mL via INTRAVENOUS

## 2018-10-04 ENCOUNTER — Encounter: Payer: Self-pay | Admitting: *Deleted

## 2018-10-06 ENCOUNTER — Other Ambulatory Visit: Payer: Self-pay

## 2018-10-07 ENCOUNTER — Encounter: Payer: Self-pay | Admitting: Cardiovascular Disease

## 2018-10-07 ENCOUNTER — Telehealth (INDEPENDENT_AMBULATORY_CARE_PROVIDER_SITE_OTHER): Payer: Medicare Other | Admitting: Cardiovascular Disease

## 2018-10-07 VITALS — BP 138/76 | Ht 61.0 in | Wt 138.0 lb

## 2018-10-07 DIAGNOSIS — E7800 Pure hypercholesterolemia, unspecified: Secondary | ICD-10-CM | POA: Diagnosis not present

## 2018-10-07 DIAGNOSIS — I10 Essential (primary) hypertension: Secondary | ICD-10-CM | POA: Diagnosis not present

## 2018-10-07 DIAGNOSIS — R072 Precordial pain: Secondary | ICD-10-CM

## 2018-10-07 NOTE — Telephone Encounter (Signed)
New Message              Patient is needing a call back she states she was cut off from [facility].

## 2018-10-07 NOTE — Patient Instructions (Addendum)
   Medication Instructions:  Your physician recommends that you continue on your current medications as directed. Please refer to the Current Medication list given to you today.  If you need a refill on your cardiac medications before your next appointment, please call your pharmacy.   Lab work: NONE  FOLLOW UP AS NEEDED

## 2018-10-07 NOTE — Progress Notes (Signed)
Virtual Visit via Phone Note    Evaluation Performed:  Follow-up visit  This visit type was conducted due to national recommendations for restrictions regarding the COVID-19 Pandemic (e.g. social distancing).  This format is felt to be most appropriate for this patient at this time.  All issues noted in this document were discussed and addressed.  No physical exam was performed (except for noted visual exam findings with Video Visits).  Please refer to the patient's chart (MyChart message for video visits and phone note for telephone visits) for the patient's consent to telehealth for [facility].  Date:  10/07/2018   ID:  Nancy Schmidt, DOB 1941-02-23, MRN [id number]  Patient Location:  home  Provider location:   Office  PCP:  Jonathon Jordan, MD  Cardiologist:  No primary care provider on file.  Electrophysiologist:  None   Chief Complaint:  Follow up  History of Present Illness:    Nancy Schmidt is a 78 y.o. female who presents via audio conferencing for a telehealth visit today.    [name] is a 78 y.o. female with hyperlipidemia who presents for follow-up.  Ms. Wires was initially seen 02/2015 for the evaluation of chest pain.  [name] was evaluated in the ED on 02/16/15 with chest pain.  Cardiac enzymes were negative and Lexiscan Myoview was negative for ischemia.  LVEF was 70%.  Atorvastatin 40mg  was started for hyperlipidemia.  She followed up in clinic the following month and was doing well.  That time she was instructed to follow-up as needed.  She recently saw her PCP and noted some left arm numbness.  It is sometimes positional.  She notes it when raising her arm or when sleeping at night.  This is also sometimes associated with left facial numbness.  Although her symptoms are atypical she was very concerned about coronary artery disease since her husband had recently been diagnosed with a blockage.  She was referred for coronary CT-A 08/2018 that  revealed normal coronary arteries and a coronary calcium score of 0.  She had very mild atherosclerosis of the aorta.  Since her last appointment [name] has been doing well. She hasn't had any chest pain.  Her breathing has been stable.  Her BP is in the 120s/60s.  She has leg fatigue with exertion but ABIs were normal.  She attributes this to not exercising.  She has no edema, orthopnea or PND.   The patient does not have symptoms concerning for COVID-19 infection (fever, chills, cough, or new shortness of breath).    Prior CV studies:   The following studies were reviewed today:  Coronary CT-A 08/11/18: IMPRESSION: 1. Coronary calcium score of 0. This was 0 percentile for age and sex matched control.  2. Normal coronary origin with right dominance.  3. No evidence of CAD.   Past Medical History:  Diagnosis Date  . Arthritis   . Depression   . Essential hypertension 07/16/2018  . Hyperlipidemia   . Hypertension   . Left arm numbness 07/16/2018  . Osteopenia   . Vitamin D deficiency    Past Surgical History:  Procedure Laterality Date  . ABDOMINAL HYSTERECTOMY       No outpatient medications have been marked as taking for the 10/07/18 encounter (Appointment) with Skeet Latch, MD.     Allergies:   Other   Social History   Tobacco Use  . Smoking status: Former Smoker    Packs/day: 1.00    Years: 30.00  Pack years: 30.00    Types: Cigarettes    Last attempt to quit: 02/16/1995    Years since quitting: 23.6  . Smokeless tobacco: Never Used  Substance Use Topics  . Alcohol use: Yes    Alcohol/week: 14.0 standard drinks    Types: 14 Standard drinks or equivalent per week    Comment: 2 drinks/day  . Drug use: No     Family Hx: The patient's family history includes CAD in her mother; Coronary artery disease in her maternal aunt; Heart attack in her mother; Heart failure in her mother; Liver disease in her father.  ROS:   Please see the history of  present illness.     All other systems reviewed and are negative.   Labs/Other Tests and Data Reviewed:    Recent Labs: 08/11/2018: Creatinine, Ser 0.80   Recent Lipid Panel Lab Results  Component Value Date/Time   CHOL 238 (H) 02/17/2015 05:30 AM   TRIG 185 (H) 02/17/2015 05:30 AM   HDL 53 02/17/2015 05:30 AM   CHOLHDL 4.5 02/17/2015 05:30 AM   LDLCALC 148 (H) 02/17/2015 05:30 AM    Wt Readings from Last 3 Encounters:  07/16/18 142 lb 3.2 oz (64.5 kg)  03/22/15 134 lb 6.4 oz (61 kg)  02/16/15 133 lb 4.8 oz (60.5 kg)     Objective:    BP 138/76 Comment: WALKING UP STAIRS AND HAD JUST TAKEN MEDS 10 MIN PRIOR  Ht 5\' 1"  (1.549 m)   Wt 138 lb (62.6 kg)   BMI 26.07 kg/m  GENERAL: Sounds well.  Breathing unlabored.  Speech fluent   ASSESSMENT & PLAN:    # Atypical CP:  # L arm pain: Resolved.  Coronary CT-A was negative for CAD.   # Hyperlipidemia:  Ms. Settlemyre was previously on atorvastatin and she discontinued it because she wanted to work on diet.  Her ASCVD ten-year risk is 27.6%.  She should certainly be on a statin.  She had mild atherosclerosis of the aorta and no CAD.  She isn't interested in restarting a statin though it was encouraged.  # Hypertension: BP controlled on repeat.  Continue lisinopril.  # LE discomfort:  TP pulses are diminished.  ABIs are normal.  COVID-19 Education: The signs and symptoms of COVID-19 were discussed with the patient and how to seek care for testing (follow up with PCP or arrange E-visit).  The importance of social distancing was discussed today.  Patient Risk:   After full review of this patient's clinical status, I feel that they are at least moderate risk at this time.  Time:   Today, I have spent 24 minutes with the patient with telehealth technology discussing her studies and hyperlipidemia.     Medication Adjustments/Labs and Tests Ordered: Current medicines are reviewed at length with the patient today.  Concerns  regarding medicines are outlined above.  Tests Ordered: No orders of the defined types were placed in this encounter.  Medication Changes: No orders of the defined types were placed in this encounter.   Disposition:  Follow up prn  Signed, Skeet Latch, MD  10/07/2018 1:58 PM    [facility]

## 2018-10-08 ENCOUNTER — Ambulatory Visit: Payer: Medicare Other | Admitting: Cardiovascular Disease

## 2019-02-06 DIAGNOSIS — E039 Hypothyroidism, unspecified: Secondary | ICD-10-CM | POA: Diagnosis not present

## 2019-03-05 DIAGNOSIS — H6123 Impacted cerumen, bilateral: Secondary | ICD-10-CM | POA: Diagnosis not present

## 2019-03-05 DIAGNOSIS — J310 Chronic rhinitis: Secondary | ICD-10-CM | POA: Diagnosis not present

## 2019-03-05 DIAGNOSIS — J018 Other acute sinusitis: Secondary | ICD-10-CM | POA: Diagnosis not present

## 2019-03-13 DIAGNOSIS — Z23 Encounter for immunization: Secondary | ICD-10-CM | POA: Diagnosis not present

## 2019-07-17 DIAGNOSIS — K5730 Diverticulosis of large intestine without perforation or abscess without bleeding: Secondary | ICD-10-CM | POA: Diagnosis not present

## 2019-07-17 DIAGNOSIS — Z79899 Other long term (current) drug therapy: Secondary | ICD-10-CM | POA: Diagnosis not present

## 2019-07-17 DIAGNOSIS — E039 Hypothyroidism, unspecified: Secondary | ICD-10-CM | POA: Diagnosis not present

## 2019-07-17 DIAGNOSIS — F3342 Major depressive disorder, recurrent, in full remission: Secondary | ICD-10-CM | POA: Diagnosis not present

## 2019-07-17 DIAGNOSIS — Z0000 Encounter for general adult medical examination without abnormal findings: Secondary | ICD-10-CM | POA: Diagnosis not present

## 2019-07-17 DIAGNOSIS — M810 Age-related osteoporosis without current pathological fracture: Secondary | ICD-10-CM | POA: Diagnosis not present

## 2019-07-17 DIAGNOSIS — E785 Hyperlipidemia, unspecified: Secondary | ICD-10-CM | POA: Diagnosis not present

## 2019-07-17 DIAGNOSIS — E559 Vitamin D deficiency, unspecified: Secondary | ICD-10-CM | POA: Diagnosis not present

## 2019-08-03 DIAGNOSIS — Z780 Asymptomatic menopausal state: Secondary | ICD-10-CM | POA: Diagnosis not present

## 2019-08-03 DIAGNOSIS — Z1231 Encounter for screening mammogram for malignant neoplasm of breast: Secondary | ICD-10-CM | POA: Diagnosis not present

## 2019-08-03 DIAGNOSIS — M810 Age-related osteoporosis without current pathological fracture: Secondary | ICD-10-CM | POA: Diagnosis not present

## 2019-08-03 DIAGNOSIS — M8588 Other specified disorders of bone density and structure, other site: Secondary | ICD-10-CM | POA: Diagnosis not present

## 2019-08-15 ENCOUNTER — Ambulatory Visit: Payer: Medicare Other | Attending: Internal Medicine

## 2019-08-15 DIAGNOSIS — Z23 Encounter for immunization: Secondary | ICD-10-CM | POA: Insufficient documentation

## 2019-08-15 NOTE — Progress Notes (Signed)
   Covid-19 Vaccination Clinic  Name:  ANOKHI ALONGE    MRN: [id number] DOB: 08-11-1940  08/15/2019  Ms. Keach was observed post Covid-19 immunization for 15 minutes without incidence. She was provided with Vaccine Information Sheet and instruction to access the V-Safe system.   Ms. Suminski was instructed to call 911 with any severe reactions post vaccine: Marland Kitchen Difficulty breathing  . Swelling of your face and throat  . A fast heartbeat  . A bad rash all over your body  . Dizziness and weakness    Immunizations Administered    Name Date Dose VIS Date Route   Pfizer COVID-19 Vaccine 08/15/2019 11:01 AM 0.3 mL 06/19/2019 Intramuscular   Manufacturer: [place]   Lot: [id number]   [place]: [id number]

## 2019-08-17 DIAGNOSIS — L249 Irritant contact dermatitis, unspecified cause: Secondary | ICD-10-CM | POA: Diagnosis not present

## 2019-09-08 ENCOUNTER — Ambulatory Visit: Payer: Medicare Other | Attending: Internal Medicine

## 2019-09-08 DIAGNOSIS — Z23 Encounter for immunization: Secondary | ICD-10-CM

## 2019-09-08 NOTE — Progress Notes (Signed)
   Covid-19 Vaccination Clinic  Name:  Nancy Schmidt    MRN: [id number] DOB: 1941/01/19  09/08/2019  [name] was observed post Covid-19 immunization for 15 minutes without incident. She was provided with Vaccine Information Sheet and instruction to access the V-Safe system.   [name] was instructed to call 911 with any severe reactions post vaccine: Marland Kitchen Difficulty breathing  . Swelling of face and throat  . A fast heartbeat  . A bad rash all over body  . Dizziness and weakness   Immunizations Administered    Name Date Dose VIS Date Route   Pfizer COVID-19 Vaccine 09/08/2019  9:07 AM 0.3 mL 06/19/2019 Intramuscular   Manufacturer: [place]   Lot: [id number]   [place]: [id number]

## 2019-11-24 DIAGNOSIS — H903 Sensorineural hearing loss, bilateral: Secondary | ICD-10-CM | POA: Diagnosis not present

## 2019-11-24 DIAGNOSIS — H6122 Impacted cerumen, left ear: Secondary | ICD-10-CM | POA: Diagnosis not present

## 2020-01-06 DIAGNOSIS — R05 Cough: Secondary | ICD-10-CM | POA: Diagnosis not present

## 2020-01-09 ENCOUNTER — Emergency Department (HOSPITAL_COMMUNITY): Payer: Medicare Other

## 2020-01-09 ENCOUNTER — Other Ambulatory Visit: Payer: Self-pay

## 2020-01-09 ENCOUNTER — Emergency Department (HOSPITAL_COMMUNITY)
Admission: EM | Admit: 2020-01-09 | Discharge: 2020-01-09 | Disposition: A | Discharge status: Home / Self Care | Payer: Medicare Other | Attending: Emergency Medicine | Admitting: Emergency Medicine

## 2020-01-09 ENCOUNTER — Encounter (HOSPITAL_COMMUNITY): Payer: Self-pay | Admitting: Emergency Medicine

## 2020-01-09 DIAGNOSIS — Z79899 Other long term (current) drug therapy: Secondary | ICD-10-CM | POA: Diagnosis not present

## 2020-01-09 DIAGNOSIS — Z7982 Long term (current) use of aspirin: Secondary | ICD-10-CM | POA: Insufficient documentation

## 2020-01-09 DIAGNOSIS — I10 Essential (primary) hypertension: Secondary | ICD-10-CM | POA: Diagnosis not present

## 2020-01-09 DIAGNOSIS — R1084 Generalized abdominal pain: Secondary | ICD-10-CM

## 2020-01-09 DIAGNOSIS — R197 Diarrhea, unspecified: Secondary | ICD-10-CM | POA: Insufficient documentation

## 2020-01-09 DIAGNOSIS — R112 Nausea with vomiting, unspecified: Secondary | ICD-10-CM | POA: Diagnosis not present

## 2020-01-09 DIAGNOSIS — Z87891 Personal history of nicotine dependence: Secondary | ICD-10-CM | POA: Insufficient documentation

## 2020-01-09 LAB — URINALYSIS, ROUTINE W REFLEX MICROSCOPIC
Bilirubin Urine: NEGATIVE
Glucose, UA: NEGATIVE mg/dL
Hgb urine dipstick: NEGATIVE
Ketones, ur: NEGATIVE mg/dL
Leukocytes,Ua: NEGATIVE
Nitrite: NEGATIVE
Protein, ur: NEGATIVE mg/dL
Specific Gravity, Urine: 1.018 (ref 1.005–1.030)
pH: 5 (ref 5.0–8.0)

## 2020-01-09 LAB — CBC
HCT: 43.2 % (ref 36.0–46.0)
Hemoglobin: 14 g/dL (ref 12.0–15.0)
MCH: 30.6 pg (ref 26.0–34.0)
MCHC: 32.4 g/dL (ref 30.0–36.0)
MCV: 94.3 fL (ref 80.0–100.0)
Platelets: 246 10*3/uL (ref 150–400)
RBC: 4.58 MIL/uL (ref 3.87–5.11)
RDW: 12.4 % (ref 11.5–15.5)
WBC: 10.7 10*3/uL — ABNORMAL HIGH (ref 4.0–10.5)
nRBC: 0 % (ref 0.0–0.2)

## 2020-01-09 LAB — COMPREHENSIVE METABOLIC PANEL
ALT: 22 U/L (ref 0–44)
AST: 25 U/L (ref 15–41)
Albumin: 3.9 g/dL (ref 3.5–5.0)
Alkaline Phosphatase: 86 U/L (ref 38–126)
Anion gap: 10 (ref 5–15)
BUN: 22 mg/dL (ref 8–23)
CO2: 23 mmol/L (ref 22–32)
Calcium: 9.2 mg/dL (ref 8.9–10.3)
Chloride: 106 mmol/L (ref 98–111)
Creatinine, Ser: 0.71 mg/dL (ref 0.44–1.00)
GFR calc Af Amer: >60 mL/min (ref >60)
GFR calc non Af Amer: >60 mL/min (ref >60)
Glucose, Bld: 130 mg/dL — ABNORMAL HIGH (ref 70–99)
Potassium: 3.9 mmol/L (ref 3.5–5.1)
Sodium: 139 mmol/L (ref 135–145)
Total Bilirubin: 0.7 mg/dL (ref 0.3–1.2)
Total Protein: 6.7 g/dL (ref 6.5–8.1)

## 2020-01-09 LAB — LIPASE, BLOOD: Lipase: 28 U/L (ref 11–51)

## 2020-01-09 MED ORDER — ONDANSETRON HCL 4 MG/2ML IJ SOLN
4.0000 mg | Freq: Once | INTRAMUSCULAR | Status: AC
  Administered 2020-01-09: 4 mg via INTRAVENOUS
  Filled 2020-01-09: qty 2

## 2020-01-09 MED ORDER — SODIUM CHLORIDE 0.9 % IV BOLUS
1000.0000 mL | Freq: Once | INTRAVENOUS | Status: AC
  Administered 2020-01-09: 1000 mL via INTRAVENOUS

## 2020-01-09 MED ORDER — MORPHINE SULFATE (PF) 4 MG/ML IV SOLN
4.0000 mg | Freq: Once | INTRAVENOUS | Status: AC
  Administered 2020-01-09: 4 mg via INTRAVENOUS
  Filled 2020-01-09: qty 1

## 2020-01-09 MED ORDER — IOHEXOL 300 MG/ML  SOLN
100.0000 mL | Freq: Once | INTRAMUSCULAR | Status: AC | PRN
  Administered 2020-01-09: 100 mL via INTRAVENOUS

## 2020-01-09 MED ORDER — MORPHINE SULFATE 15 MG PO TABS: 7.5000 mg | ORAL_TABLET | ORAL | 0 refills | Status: AC | PRN

## 2020-01-09 MED ORDER — SODIUM CHLORIDE 0.9% FLUSH
3.0000 mL | Freq: Once | INTRAVENOUS | Status: AC
  Administered 2020-01-09: 3 mL via INTRAVENOUS

## 2020-01-09 MED ORDER — ONDANSETRON HCL 4 MG PO TABS: 4.0000 mg | ORAL_TABLET | Freq: Three times a day (TID) | ORAL | 0 refills | Status: AC | PRN

## 2020-01-09 NOTE — ED Triage Notes (Signed)
Pt. Stated, I started at 0500 this morning with stomach pain with Nausea, diarrhea,

## 2020-01-09 NOTE — ED Notes (Signed)
Pt transported to CT ?

## 2020-01-09 NOTE — ED Provider Notes (Signed)
[facility] EMERGENCY DEPARTMENT Provider Note   CSN: [id number] Arrival date & time: 01/09/20  1202     History Chief Complaint  Patient presents with  . Abdominal Pain  . Diarrhea  . Nausea    Nancy Schmidt is a 79 y.o. female.  79 yo F with a chief complaints of diarrhea nausea vomiting and abdominal pain.  The abdominal pain is diffuse start to the upper part of the abdomen and goes lower.  She denies dark stool or blood in her stool denies suspicious food intake denies recent travel.  Denies recent antibiotic use.  Denies vaginal bleeding or discharge.  Has a prior hysterectomy denies other abdominal surgery.  The history is provided by the patient.  Abdominal Pain Pain location:  Generalized Pain quality: cramping   Pain radiates to:  Does not radiate Pain severity:  Moderate Onset quality:  Gradual Duration:  7 hours Timing:  Constant Progression:  Worsening Chronicity:  New Relieved by:  Nothing Worsened by:  Nothing Ineffective treatments:  None tried Associated symptoms: diarrhea, nausea and vomiting   Associated symptoms: no chest pain, no chills, no dysuria, no fever and no shortness of breath   Diarrhea Associated symptoms: abdominal pain and vomiting   Associated symptoms: no arthralgias, no chills, no fever, no headaches and no myalgias        Past Medical History:  Diagnosis Date  . Arthritis   . Depression   . Essential hypertension 07/16/2018  . Hyperlipidemia   . Hypertension   . Left arm numbness 07/16/2018  . Osteopenia   . Vitamin D deficiency     Patient Active Problem List   Diagnosis Date Noted  . Left arm numbness 07/16/2018  . Essential hypertension 07/16/2018  . Chest pain with moderate risk for cardiac etiology 02/16/2015  . Chest pain 02/16/2015  . Hyperlipidemia     Past Surgical History:  Procedure Laterality Date  . ABDOMINAL HYSTERECTOMY       OB History   No obstetric history on file.      Family History  Problem Relation Age of Onset  . Heart attack Mother        Deceased at age 68  . Heart failure Mother   . CAD Mother   . Liver disease Father   . Coronary artery disease Maternal Aunt     Social History   Tobacco Use  . Smoking status: Former Smoker    Packs/day: 1.00    Years: 30.00    Pack years: 30.00    Types: Cigarettes    Quit date: 02/16/1995    Years since quitting: 24.9  . Smokeless tobacco: Never Used  Substance Use Topics  . Alcohol use: Yes    Alcohol/week: 14.0 standard drinks    Types: 14 Standard drinks or equivalent per week    Comment: 2 drinks/day  . Drug use: No    Home Medications Prior to Admission medications   Medication Sig Start Date End Date Taking? Authorizing Provider  aspirin EC 81 MG tablet Take 81 mg by mouth 2 (two) times a week.     [provider]  FLUoxetine (PROZAC) 10 MG capsule Take 20 mg by mouth daily.     [provider]  levothyroxine (SYNTHROID, LEVOTHROID) 25 MCG tablet Take 25 mcg by mouth as directed. TAKE 1/2 TABLET BY MOUTH DAILY    [provider]  lisinopril (PRINIVIL,ZESTRIL) 10 MG tablet Take 10 mg by mouth daily.  [provider]  morphine (MSIR) 15 MG tablet Take 0.5 tablets (7.5 mg total) by mouth every 4 (four) hours as needed for severe pain. 01/09/20   Deno Etienne, DO  Multiple Vitamin (MULTIVITAMIN WITH MINERALS) TABS tablet Take 1 tablet by mouth daily.    [provider]  ondansetron (ZOFRAN) 4 MG tablet Take 1 tablet (4 mg total) by mouth every 8 (eight) hours as needed for nausea or vomiting. 01/09/20   Deno Etienne, DO    Allergies    Other  Review of Systems   Review of Systems  Constitutional: Negative for chills and fever.  HENT: Negative for congestion and rhinorrhea.   Eyes: Negative for redness and visual disturbance.  Respiratory: Negative for shortness of breath and wheezing.   Cardiovascular: Negative for chest pain and palpitations.   Gastrointestinal: Positive for abdominal pain, diarrhea, nausea and vomiting.  Genitourinary: Negative for dysuria and urgency.  Musculoskeletal: Negative for arthralgias and myalgias.  Skin: Negative for pallor and wound.  Neurological: Negative for dizziness and headaches.    Physical Exam Updated Vital Signs BP (!) 132/52   Pulse 72   Temp 98.3 F (36.8 C)   Resp 17   SpO2 93%   Physical Exam Vitals and nursing note reviewed.  Constitutional:      General: She is not in acute distress.    Appearance: She is well-developed. She is not diaphoretic.  HENT:     Head: Normocephalic and atraumatic.  Eyes:     Pupils: Pupils are equal, round, and reactive to light.  Cardiovascular:     Rate and Rhythm: Normal rate and regular rhythm.     Heart sounds: No murmur heard.  No friction rub. No gallop.   Pulmonary:     Effort: Pulmonary effort is normal.     Breath sounds: No wheezing or rales.  Abdominal:     General: There is no distension.     Palpations: Abdomen is soft.     Tenderness: There is abdominal tenderness (generalized).  Musculoskeletal:        General: No tenderness.     Cervical back: Normal range of motion and neck supple.  Skin:    General: Skin is warm and dry.  Neurological:     Mental Status: She is alert and oriented to person, place, and time.  Psychiatric:        Behavior: Behavior normal.     ED Results / Procedures / Treatments   Labs (all labs ordered are listed, but only abnormal results are displayed) Labs Reviewed  COMPREHENSIVE METABOLIC PANEL - Abnormal; Notable for the following components:      Result Value   Glucose, Bld 130 (*)    All other components within normal limits  CBC - Abnormal; Notable for the following components:   WBC 10.7 (*)    All other components within normal limits  LIPASE, BLOOD  URINALYSIS, ROUTINE W REFLEX MICROSCOPIC    EKG None  Radiology CT ABDOMEN PELVIS W CONTRAST  Result Date:  01/09/2020 CLINICAL DATA:  Lower abdomen pain starting this morning EXAM: CT ABDOMEN AND PELVIS WITH CONTRAST TECHNIQUE: Multidetector CT imaging of the abdomen and pelvis was performed using the standard protocol following bolus administration of intravenous contrast. CONTRAST:  166mL OMNIPAQUE IOHEXOL 300 MG/ML  SOLN COMPARISON:  None. FINDINGS: Lower chest: Mild scar atelectasis is identified in the lung bases. The heart size normal. Hepatobiliary: Liver cysts are identified. Diffuse low density of the liver is identified  without vessel displacement. The gallbladder is normal. The biliary tree is normal. Pancreas: Unremarkable. No pancreatic ductal dilatation or surrounding inflammatory changes. Spleen: Normal in size without focal abnormality. Adrenals/Urinary Tract: The bilateral adrenal glands are normal. Bilateral kidney cysts are identified. No hydronephrosis noted bilaterally. Bladder is normal. Stomach/Bowel: Stomach is within normal limits. Appendix is not seen but no inflammatory changes noted around cecum. No evidence of bowel wall thickening, distention, or inflammatory changes. There is diverticulosis of colon without diverticulitis. Vascular/Lymphatic: Aortic atherosclerosis. No enlarged abdominal or pelvic lymph nodes. Reproductive: Status post hysterectomy. No adnexal masses. Other: None. Musculoskeletal: Degenerative joint changes of the spine are noted. IMPRESSION: 1. No acute abnormality identified in the abdomen and pelvis. 2. Fatty infiltration of liver. 3. Bilateral kidney cysts and liver cysts. 4. Aortic atherosclerosis. Aortic Atherosclerosis (ICD10-I70.0). Electronically Signed   By: Abelardo Diesel M.D.   On: 01/09/2020 15:02    Procedures Procedures (including critical care time)  Medications Ordered in ED Medications  sodium chloride flush (NS) 0.9 % injection 3 mL (3 mLs Intravenous Given 01/09/20 1252)  sodium chloride 0.9 % bolus 1,000 mL (0 mLs Intravenous Stopped 01/09/20 1336)   morphine 4 MG/ML injection 4 mg (4 mg Intravenous Given 01/09/20 1254)  ondansetron (ZOFRAN) injection 4 mg (4 mg Intravenous Given 01/09/20 1254)  iohexol (OMNIPAQUE) 300 MG/ML solution 100 mL (100 mLs Intravenous Contrast Given 01/09/20 1443)    ED Course  I have reviewed the triage vital signs and the nursing notes.  Pertinent labs & imaging results that were available during my care of the patient were reviewed by me and considered in my medical decision making (see chart for details).    MDM Rules/Calculators/A&P                          79 yo F with a chief complaints of diffuse abdominal pain.  Going on since this morning.  She thought it was related to some cheese that she ate yesterday and so she took 2 laxatives and then had profuse diarrhea.  Benign exam for me.  Will obtain laboratory evaluation and CT scan of the abdomen pelvis reassess.  CT without obvious pathology.  Patient feeling mildly better.  Tolerating PO.  D/c home.   :  I have discussed the diagnosis/risks/treatment options with the patient and believe the pt to be eligible for discharge home to follow-up with PCP. We also discussed returning to the ED immediately if new or worsening sx occur. We discussed the sx which are most concerning (e.g., sudden worsening pain, fever, inability to tolerate by mouth) that necessitate immediate return. Medications administered to the patient during their visit and any new prescriptions provided to the patient are listed below.  Medications given during this visit Medications  sodium chloride flush (NS) 0.9 % injection 3 mL (3 mLs Intravenous Given 01/09/20 1252)  sodium chloride 0.9 % bolus 1,000 mL (0 mLs Intravenous Stopped 01/09/20 1336)  morphine 4 MG/ML injection 4 mg (4 mg Intravenous Given 01/09/20 1254)  ondansetron (ZOFRAN) injection 4 mg (4 mg Intravenous Given 01/09/20 1254)  iohexol (OMNIPAQUE) 300 MG/ML solution 100 mL (100 mLs Intravenous Contrast Given 01/09/20 1443)      The patient appears reasonably screen and/or stabilized for discharge and I doubt any other medical condition or other [facility] requiring further screening, evaluation, or treatment in the ED at this time prior to discharge.   Final Clinical Impression(s) / ED Diagnoses Final diagnoses:  Generalized abdominal pain  Diarrhea, unspecified type    Rx / DC Orders ED Discharge Orders         Ordered    morphine (MSIR) 15 MG tablet  Every 4 hours PRN     Discontinue  Reprint     01/09/20 1519    ondansetron (ZOFRAN) 4 MG tablet  Every 8 hours PRN     Discontinue  Reprint     01/09/20 [place], Frederickson, DO 01/10/20 [phone]

## 2020-01-29 DIAGNOSIS — R1013 Epigastric pain: Secondary | ICD-10-CM | POA: Diagnosis not present

## 2020-01-29 DIAGNOSIS — K449 Diaphragmatic hernia without obstruction or gangrene: Secondary | ICD-10-CM | POA: Diagnosis not present

## 2020-01-29 DIAGNOSIS — D123 Benign neoplasm of transverse colon: Secondary | ICD-10-CM | POA: Diagnosis not present

## 2020-01-29 DIAGNOSIS — D120 Benign neoplasm of cecum: Secondary | ICD-10-CM | POA: Diagnosis not present

## 2020-01-29 DIAGNOSIS — R110 Nausea: Secondary | ICD-10-CM | POA: Diagnosis not present

## 2020-01-29 DIAGNOSIS — K2950 Unspecified chronic gastritis without bleeding: Secondary | ICD-10-CM | POA: Diagnosis not present

## 2020-01-29 DIAGNOSIS — K635 Polyp of colon: Secondary | ICD-10-CM | POA: Diagnosis not present

## 2020-01-29 DIAGNOSIS — Z86010 Personal history of colonic polyps: Secondary | ICD-10-CM | POA: Diagnosis not present

## 2020-01-29 DIAGNOSIS — K5730 Diverticulosis of large intestine without perforation or abscess without bleeding: Secondary | ICD-10-CM | POA: Diagnosis not present

## 2020-02-02 DIAGNOSIS — K635 Polyp of colon: Secondary | ICD-10-CM | POA: Diagnosis not present

## 2020-02-02 DIAGNOSIS — D123 Benign neoplasm of transverse colon: Secondary | ICD-10-CM | POA: Diagnosis not present

## 2020-02-02 DIAGNOSIS — D120 Benign neoplasm of cecum: Secondary | ICD-10-CM | POA: Diagnosis not present

## 2020-02-13 DIAGNOSIS — Z20822 Contact with and (suspected) exposure to covid-19: Secondary | ICD-10-CM | POA: Diagnosis not present

## 2020-02-14 DIAGNOSIS — Z20822 Contact with and (suspected) exposure to covid-19: Secondary | ICD-10-CM | POA: Diagnosis not present

## 2020-03-03 DIAGNOSIS — K2950 Unspecified chronic gastritis without bleeding: Secondary | ICD-10-CM | POA: Diagnosis not present

## 2020-04-11 DIAGNOSIS — Z23 Encounter for immunization: Secondary | ICD-10-CM | POA: Diagnosis not present

## 2020-05-24 DIAGNOSIS — H6122 Impacted cerumen, left ear: Secondary | ICD-10-CM | POA: Diagnosis not present

## 2020-05-24 DIAGNOSIS — H838X3 Other specified diseases of inner ear, bilateral: Secondary | ICD-10-CM | POA: Diagnosis not present

## 2020-05-24 DIAGNOSIS — H903 Sensorineural hearing loss, bilateral: Secondary | ICD-10-CM | POA: Diagnosis not present

## 2020-07-28 DIAGNOSIS — M25561 Pain in right knee: Secondary | ICD-10-CM | POA: Diagnosis not present

## 2020-08-02 DIAGNOSIS — K7689 Other specified diseases of liver: Secondary | ICD-10-CM | POA: Diagnosis not present

## 2020-08-02 DIAGNOSIS — E559 Vitamin D deficiency, unspecified: Secondary | ICD-10-CM | POA: Diagnosis not present

## 2020-08-02 DIAGNOSIS — F3342 Major depressive disorder, recurrent, in full remission: Secondary | ICD-10-CM | POA: Diagnosis not present

## 2020-08-02 DIAGNOSIS — E785 Hyperlipidemia, unspecified: Secondary | ICD-10-CM | POA: Diagnosis not present

## 2020-08-02 DIAGNOSIS — E039 Hypothyroidism, unspecified: Secondary | ICD-10-CM | POA: Diagnosis not present

## 2020-08-02 DIAGNOSIS — Z79899 Other long term (current) drug therapy: Secondary | ICD-10-CM | POA: Diagnosis not present

## 2020-08-02 DIAGNOSIS — N281 Cyst of kidney, acquired: Secondary | ICD-10-CM | POA: Diagnosis not present

## 2020-08-02 DIAGNOSIS — Z0000 Encounter for general adult medical examination without abnormal findings: Secondary | ICD-10-CM | POA: Diagnosis not present

## 2020-08-02 DIAGNOSIS — I700 Atherosclerosis of aorta: Secondary | ICD-10-CM | POA: Diagnosis not present

## 2020-08-02 DIAGNOSIS — K760 Fatty (change of) liver, not elsewhere classified: Secondary | ICD-10-CM | POA: Diagnosis not present

## 2020-08-02 DIAGNOSIS — I10 Essential (primary) hypertension: Secondary | ICD-10-CM | POA: Diagnosis not present

## 2020-08-02 DIAGNOSIS — M810 Age-related osteoporosis without current pathological fracture: Secondary | ICD-10-CM | POA: Diagnosis not present

## 2020-10-14 DIAGNOSIS — Z1231 Encounter for screening mammogram for malignant neoplasm of breast: Secondary | ICD-10-CM | POA: Diagnosis not present

## 2021-01-25 DIAGNOSIS — H25013 Cortical age-related cataract, bilateral: Secondary | ICD-10-CM | POA: Diagnosis not present

## 2021-01-25 DIAGNOSIS — H2513 Age-related nuclear cataract, bilateral: Secondary | ICD-10-CM | POA: Diagnosis not present

## 2021-01-25 DIAGNOSIS — H43813 Vitreous degeneration, bilateral: Secondary | ICD-10-CM | POA: Diagnosis not present

## 2021-01-25 DIAGNOSIS — H524 Presbyopia: Secondary | ICD-10-CM | POA: Diagnosis not present

## 2021-01-29 DIAGNOSIS — Z23 Encounter for immunization: Secondary | ICD-10-CM | POA: Diagnosis not present

## 2021-02-08 DIAGNOSIS — L720 Epidermal cyst: Secondary | ICD-10-CM | POA: Diagnosis not present

## 2021-03-30 DIAGNOSIS — H838X3 Other specified diseases of inner ear, bilateral: Secondary | ICD-10-CM | POA: Diagnosis not present

## 2021-03-30 DIAGNOSIS — H903 Sensorineural hearing loss, bilateral: Secondary | ICD-10-CM | POA: Diagnosis not present

## 2021-04-03 DIAGNOSIS — S0990XA Unspecified injury of head, initial encounter: Secondary | ICD-10-CM | POA: Diagnosis not present

## 2021-04-03 DIAGNOSIS — Z6826 Body mass index (BMI) 26.0-26.9, adult: Secondary | ICD-10-CM | POA: Diagnosis not present

## 2021-04-04 ENCOUNTER — Other Ambulatory Visit (HOSPITAL_COMMUNITY): Payer: Self-pay | Admitting: Neurosurgery

## 2021-04-04 ENCOUNTER — Other Ambulatory Visit: Payer: Self-pay | Admitting: Neurosurgery

## 2021-04-04 DIAGNOSIS — S0990XA Unspecified injury of head, initial encounter: Secondary | ICD-10-CM

## 2021-04-11 ENCOUNTER — Ambulatory Visit (HOSPITAL_COMMUNITY)
Admission: RE | Admit: 2021-04-11 | Discharge: 2021-04-11 | Disposition: A | Discharge status: Home / Self Care | Payer: Medicare Other | Source: Ambulatory Visit | Attending: Neurosurgery | Admitting: Neurosurgery

## 2021-04-11 ENCOUNTER — Other Ambulatory Visit: Payer: Self-pay

## 2021-04-11 DIAGNOSIS — S0990XA Unspecified injury of head, initial encounter: Secondary | ICD-10-CM | POA: Diagnosis not present

## 2021-04-11 DIAGNOSIS — R519 Headache, unspecified: Secondary | ICD-10-CM | POA: Diagnosis not present

## 2021-04-18 DIAGNOSIS — R42 Dizziness and giddiness: Secondary | ICD-10-CM | POA: Diagnosis not present

## 2021-04-18 DIAGNOSIS — Z6826 Body mass index (BMI) 26.0-26.9, adult: Secondary | ICD-10-CM | POA: Diagnosis not present

## 2021-07-27 DIAGNOSIS — H6123 Impacted cerumen, bilateral: Secondary | ICD-10-CM | POA: Diagnosis not present

## 2021-07-27 DIAGNOSIS — Z974 Presence of external hearing-aid: Secondary | ICD-10-CM | POA: Diagnosis not present

## 2021-07-27 DIAGNOSIS — R42 Dizziness and giddiness: Secondary | ICD-10-CM | POA: Diagnosis not present

## 2021-08-02 IMAGING — CT CT ABD-PELV W/ CM
2 of 5 series · 16 of 46 positions shown, 18 images · IV contrast (omnipaque)
Comparison: None.

CLINICAL DATA: Lower abdomen pain starting this morning

EXAM:
CT ABDOMEN AND PELVIS WITH CONTRAST
TECHNIQUE: Multidetector CT imaging of the abdomen and pelvis was performed
using the standard protocol following bolus administration of
intravenous contrast.
CONTRAST:  100mL OMNIPAQUE IOHEXOL 300 MG/ML  SOLN

[Series 3: abdomen 5.0 · axial · 0.77mm/px · z∈[+863,+1213]mm · 13 of 82 slices shown, 15 images]
[im 6/82  soft-tissue]
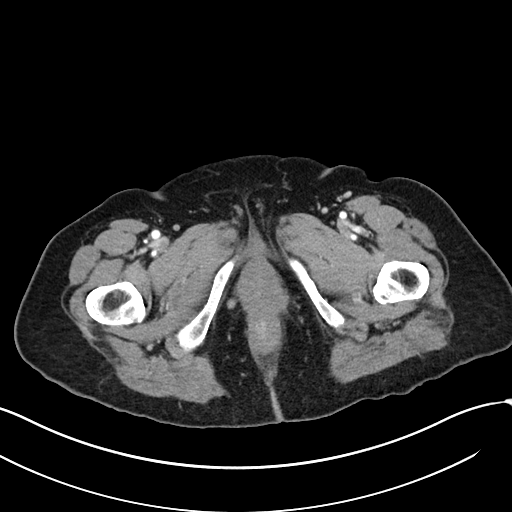
[im 6/82  bone]
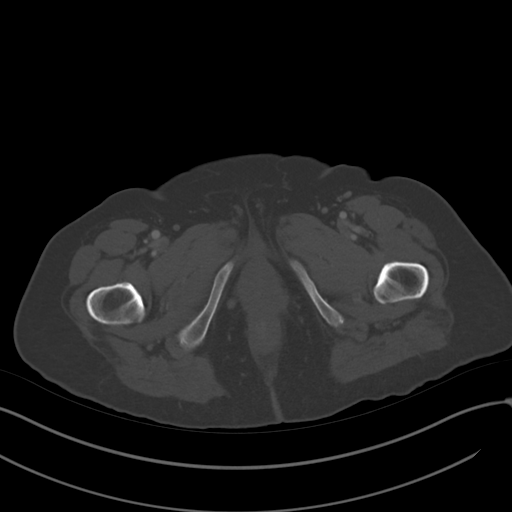
[im 12/82  soft-tissue]
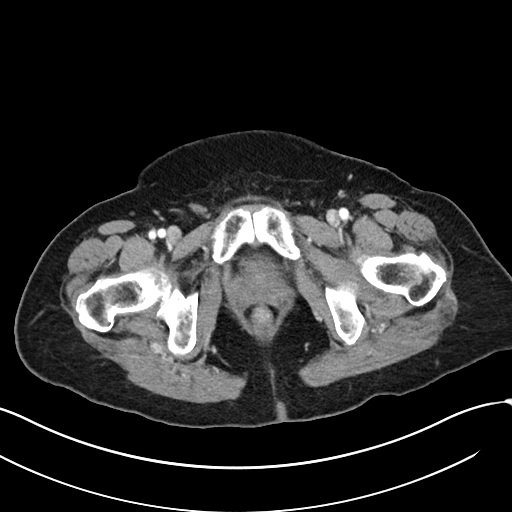
[im 18/82  soft-tissue]
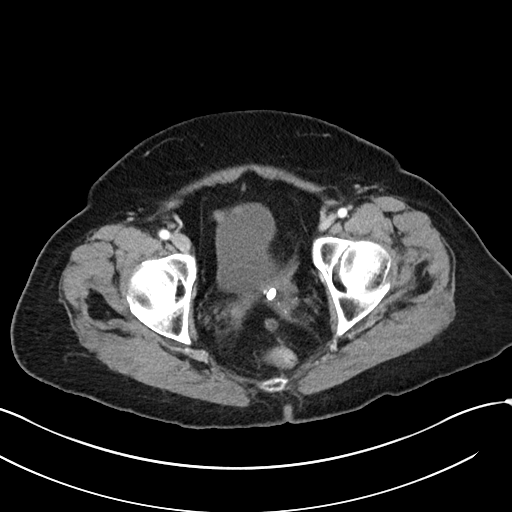
[im 24/82  soft-tissue]
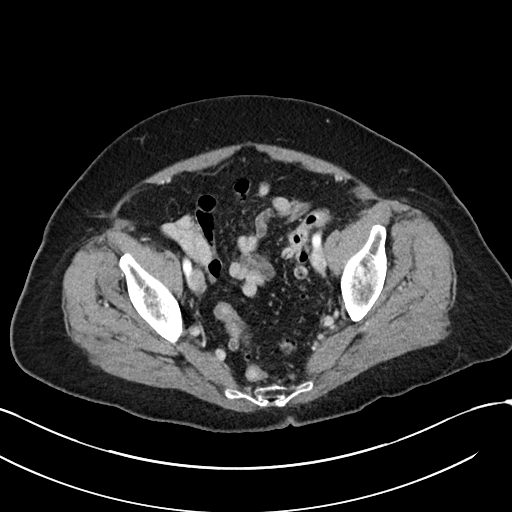
[im 29/82  soft-tissue]
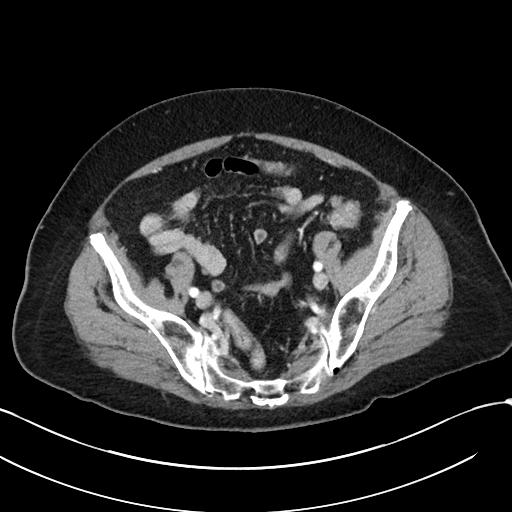
[im 35/82  soft-tissue]
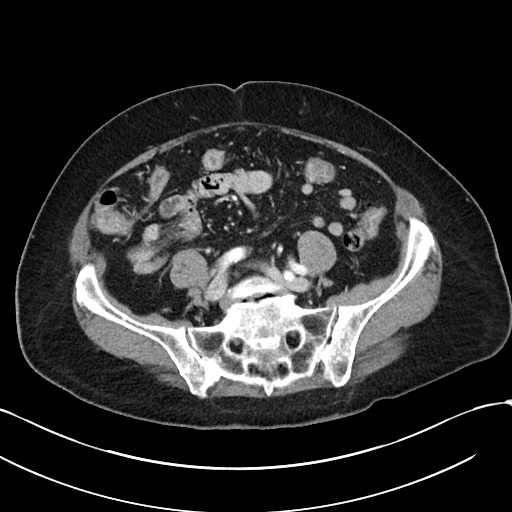
[im 41/82  soft-tissue]
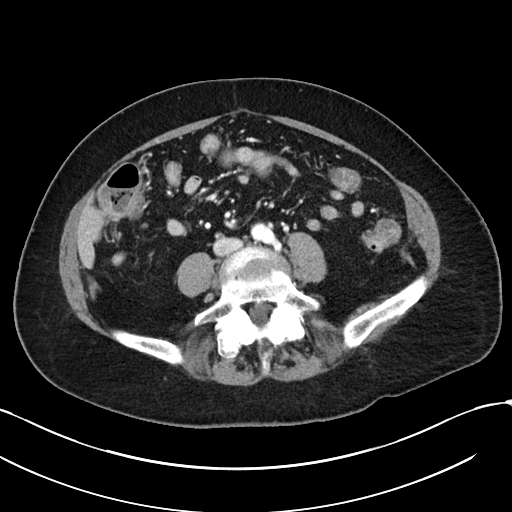
[im 47/82  soft-tissue]
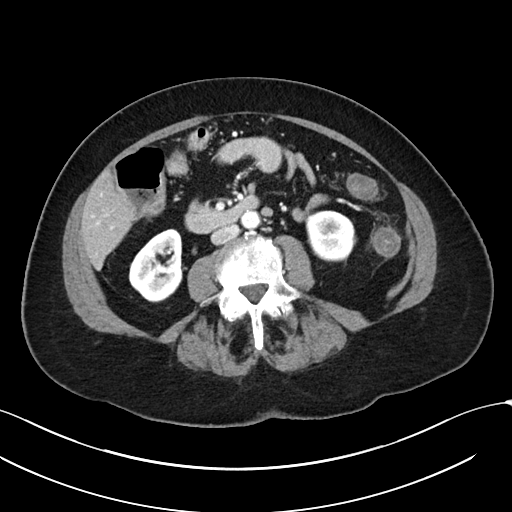
[im 53/82  soft-tissue]
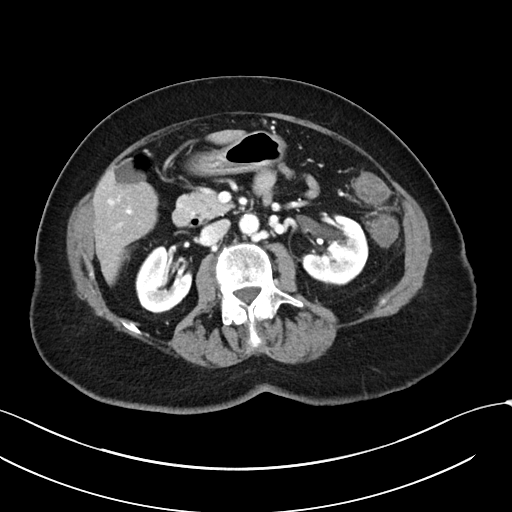
[im 53/82  bone]
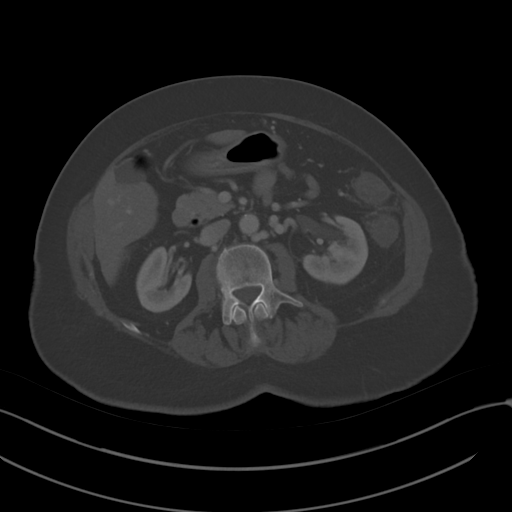
[im 58/82  soft-tissue]
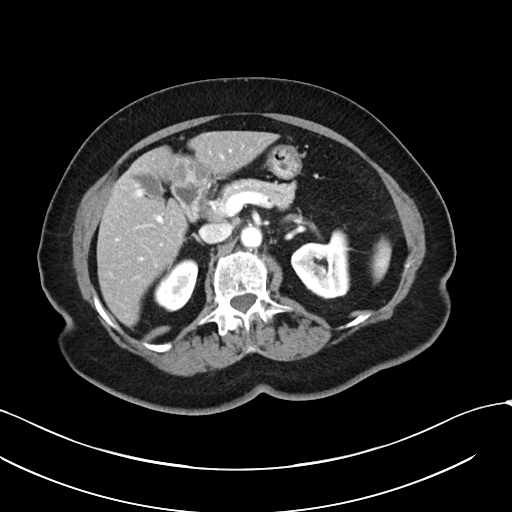
[im 64/82  soft-tissue]
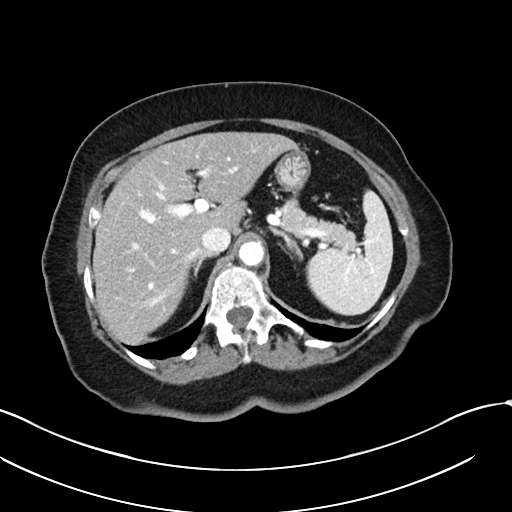
[im 70/82  soft-tissue]
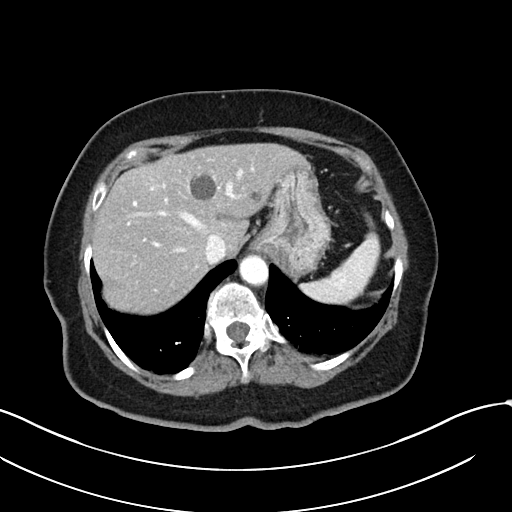
[im 76/82  soft-tissue]
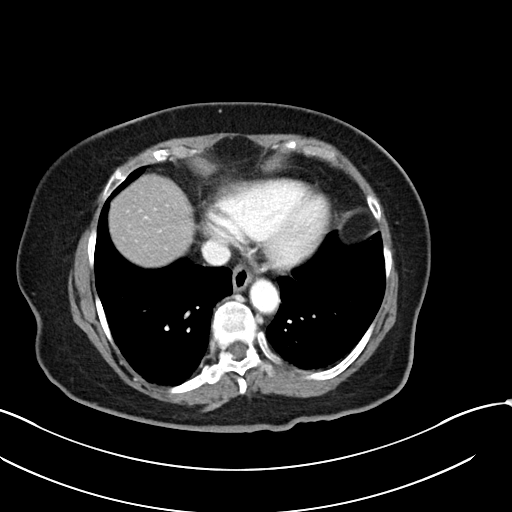

[Series 6: abdomen 3.0 mpr cor · coronal · 0.70mm/px · 3 of 80 slices shown]
[im 27/80  soft-tissue]
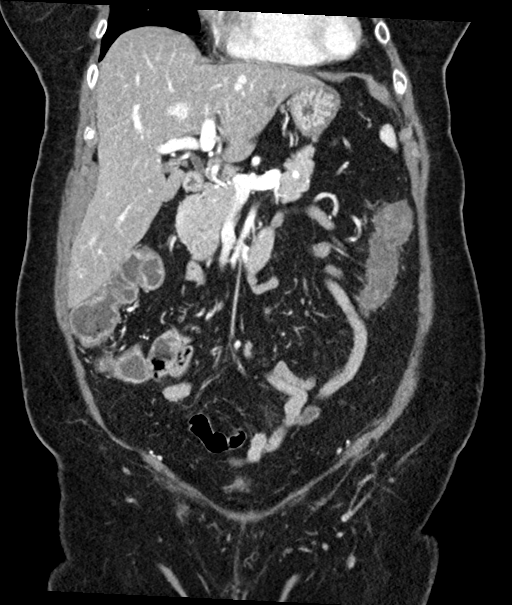
[im 36/80  soft-tissue]
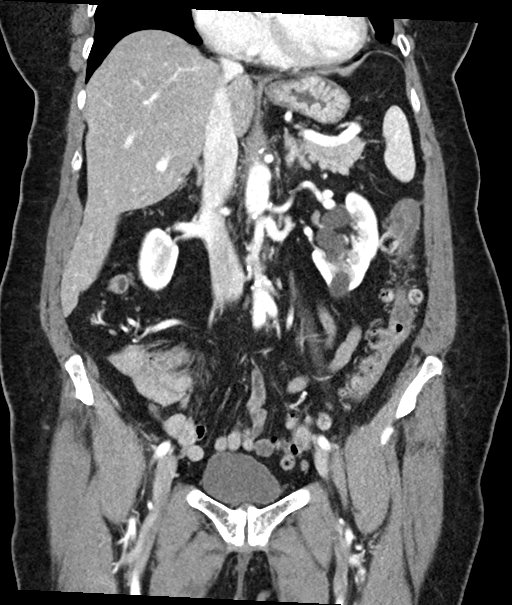
[im 44/80  soft-tissue]
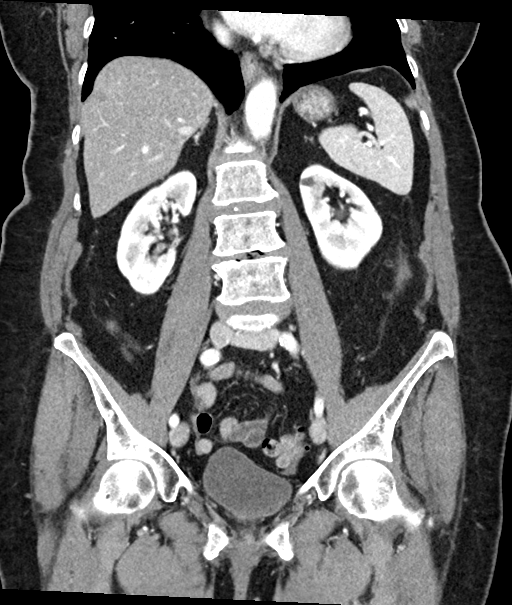

[16 of 46 positions shown; findings below may reference images not displayed]

FINDINGS: Lower chest: Mild scar atelectasis is identified in the lung bases.
The heart size normal.

Hepatobiliary: Liver cysts are identified. Diffuse low density of
the liver is identified without vessel displacement. The gallbladder
is normal. The biliary tree is normal.

Pancreas: Unremarkable. No pancreatic ductal dilatation or
surrounding inflammatory changes.

Spleen: Normal in size without focal abnormality.

Adrenals/Urinary Tract: The bilateral adrenal glands are normal.
Bilateral kidney cysts are identified. No hydronephrosis noted
bilaterally. Bladder is normal.

Stomach/Bowel: Stomach is within normal limits. Appendix is not seen
but no inflammatory changes noted around cecum. No evidence of bowel
wall thickening, distention, or inflammatory changes. There is
diverticulosis of colon without diverticulitis.

Vascular/Lymphatic: Aortic atherosclerosis. No enlarged abdominal or
pelvic lymph nodes.

Reproductive: Status post hysterectomy. No adnexal masses.

Other: None.

Musculoskeletal: Degenerative joint changes of the spine are noted.
IMPRESSION: 1. No acute abnormality identified in the abdomen and pelvis.
2. Fatty infiltration of liver.
3. Bilateral kidney cysts and liver cysts.
4. Aortic atherosclerosis.

Aortic Atherosclerosis ([id number].G).

## 2021-08-10 DIAGNOSIS — H8111 Benign paroxysmal vertigo, right ear: Secondary | ICD-10-CM | POA: Diagnosis not present

## 2021-08-21 DIAGNOSIS — Z79899 Other long term (current) drug therapy: Secondary | ICD-10-CM | POA: Diagnosis not present

## 2021-08-21 DIAGNOSIS — K7689 Other specified diseases of liver: Secondary | ICD-10-CM | POA: Diagnosis not present

## 2021-08-21 DIAGNOSIS — E785 Hyperlipidemia, unspecified: Secondary | ICD-10-CM | POA: Diagnosis not present

## 2021-08-21 DIAGNOSIS — I10 Essential (primary) hypertension: Secondary | ICD-10-CM | POA: Diagnosis not present

## 2021-08-21 DIAGNOSIS — K760 Fatty (change of) liver, not elsewhere classified: Secondary | ICD-10-CM | POA: Diagnosis not present

## 2021-08-21 DIAGNOSIS — I700 Atherosclerosis of aorta: Secondary | ICD-10-CM | POA: Diagnosis not present

## 2021-08-21 DIAGNOSIS — E039 Hypothyroidism, unspecified: Secondary | ICD-10-CM | POA: Diagnosis not present

## 2021-08-21 DIAGNOSIS — E2839 Other primary ovarian failure: Secondary | ICD-10-CM | POA: Diagnosis not present

## 2021-08-21 DIAGNOSIS — Z0000 Encounter for general adult medical examination without abnormal findings: Secondary | ICD-10-CM | POA: Diagnosis not present

## 2021-08-21 DIAGNOSIS — F3342 Major depressive disorder, recurrent, in full remission: Secondary | ICD-10-CM | POA: Diagnosis not present

## 2021-08-21 DIAGNOSIS — E559 Vitamin D deficiency, unspecified: Secondary | ICD-10-CM | POA: Diagnosis not present

## 2021-08-21 DIAGNOSIS — N281 Cyst of kidney, acquired: Secondary | ICD-10-CM | POA: Diagnosis not present

## 2021-10-05 DIAGNOSIS — E039 Hypothyroidism, unspecified: Secondary | ICD-10-CM | POA: Diagnosis not present

## 2022-09-03 DIAGNOSIS — E039 Hypothyroidism, unspecified: Secondary | ICD-10-CM | POA: Diagnosis not present

## 2022-09-03 DIAGNOSIS — B0089 Other herpesviral infection: Secondary | ICD-10-CM | POA: Diagnosis not present

## 2022-09-03 DIAGNOSIS — E785 Hyperlipidemia, unspecified: Secondary | ICD-10-CM | POA: Diagnosis not present

## 2022-09-03 DIAGNOSIS — K7689 Other specified diseases of liver: Secondary | ICD-10-CM | POA: Diagnosis not present

## 2022-09-03 DIAGNOSIS — E663 Overweight: Secondary | ICD-10-CM | POA: Diagnosis not present

## 2022-09-03 DIAGNOSIS — E559 Vitamin D deficiency, unspecified: Secondary | ICD-10-CM | POA: Diagnosis not present

## 2022-09-03 DIAGNOSIS — I700 Atherosclerosis of aorta: Secondary | ICD-10-CM | POA: Diagnosis not present

## 2022-09-03 DIAGNOSIS — I10 Essential (primary) hypertension: Secondary | ICD-10-CM | POA: Diagnosis not present

## 2022-09-03 DIAGNOSIS — Z0000 Encounter for general adult medical examination without abnormal findings: Secondary | ICD-10-CM | POA: Diagnosis not present

## 2022-09-03 DIAGNOSIS — M810 Age-related osteoporosis without current pathological fracture: Secondary | ICD-10-CM | POA: Diagnosis not present

## 2022-09-03 DIAGNOSIS — K760 Fatty (change of) liver, not elsewhere classified: Secondary | ICD-10-CM | POA: Diagnosis not present

## 2022-09-03 DIAGNOSIS — Z79899 Other long term (current) drug therapy: Secondary | ICD-10-CM | POA: Diagnosis not present

## 2023-03-14 DIAGNOSIS — Z23 Encounter for immunization: Secondary | ICD-10-CM | POA: Diagnosis not present

## 2023-03-14 DIAGNOSIS — E039 Hypothyroidism, unspecified: Secondary | ICD-10-CM | POA: Diagnosis not present

## 2023-03-14 DIAGNOSIS — Z6828 Body mass index (BMI) 28.0-28.9, adult: Secondary | ICD-10-CM | POA: Diagnosis not present

## 2023-03-18 DIAGNOSIS — H838X3 Other specified diseases of inner ear, bilateral: Secondary | ICD-10-CM | POA: Diagnosis not present

## 2023-03-18 DIAGNOSIS — H903 Sensorineural hearing loss, bilateral: Secondary | ICD-10-CM | POA: Diagnosis not present

## 2023-03-18 DIAGNOSIS — H6123 Impacted cerumen, bilateral: Secondary | ICD-10-CM | POA: Diagnosis not present

## 2023-03-18 DIAGNOSIS — R42 Dizziness and giddiness: Secondary | ICD-10-CM | POA: Diagnosis not present

## 2023-07-01 DIAGNOSIS — L232 Allergic contact dermatitis due to cosmetics: Secondary | ICD-10-CM | POA: Diagnosis not present

## 2023-09-17 ENCOUNTER — Other Ambulatory Visit: Payer: Self-pay | Admitting: Family Medicine

## 2023-09-17 DIAGNOSIS — E785 Hyperlipidemia, unspecified: Secondary | ICD-10-CM | POA: Diagnosis not present

## 2023-09-17 DIAGNOSIS — M810 Age-related osteoporosis without current pathological fracture: Secondary | ICD-10-CM | POA: Diagnosis not present

## 2023-09-17 DIAGNOSIS — I10 Essential (primary) hypertension: Secondary | ICD-10-CM | POA: Diagnosis not present

## 2023-09-17 DIAGNOSIS — F3340 Major depressive disorder, recurrent, in remission, unspecified: Secondary | ICD-10-CM | POA: Diagnosis not present

## 2023-09-17 DIAGNOSIS — E039 Hypothyroidism, unspecified: Secondary | ICD-10-CM | POA: Diagnosis not present

## 2023-09-17 DIAGNOSIS — Z0000 Encounter for general adult medical examination without abnormal findings: Secondary | ICD-10-CM | POA: Diagnosis not present

## 2023-09-17 DIAGNOSIS — B0089 Other herpesviral infection: Secondary | ICD-10-CM | POA: Diagnosis not present

## 2023-09-17 DIAGNOSIS — Z23 Encounter for immunization: Secondary | ICD-10-CM | POA: Diagnosis not present

## 2023-12-03 DIAGNOSIS — H5201 Hypermetropia, right eye: Secondary | ICD-10-CM | POA: Diagnosis not present

## 2023-12-03 DIAGNOSIS — H43813 Vitreous degeneration, bilateral: Secondary | ICD-10-CM | POA: Diagnosis not present

## 2023-12-03 DIAGNOSIS — H524 Presbyopia: Secondary | ICD-10-CM | POA: Diagnosis not present

## 2023-12-03 DIAGNOSIS — H353122 Nonexudative age-related macular degeneration, left eye, intermediate dry stage: Secondary | ICD-10-CM | POA: Diagnosis not present

## 2023-12-03 DIAGNOSIS — H2513 Age-related nuclear cataract, bilateral: Secondary | ICD-10-CM | POA: Diagnosis not present

## 2023-12-03 DIAGNOSIS — H25013 Cortical age-related cataract, bilateral: Secondary | ICD-10-CM | POA: Diagnosis not present

## 2023-12-03 DIAGNOSIS — H40023 Open angle with borderline findings, high risk, bilateral: Secondary | ICD-10-CM | POA: Diagnosis not present

## 2023-12-16 DIAGNOSIS — Z87891 Personal history of nicotine dependence: Secondary | ICD-10-CM | POA: Diagnosis not present

## 2023-12-16 DIAGNOSIS — Z961 Presence of intraocular lens: Secondary | ICD-10-CM | POA: Diagnosis not present

## 2023-12-16 DIAGNOSIS — I10 Essential (primary) hypertension: Secondary | ICD-10-CM | POA: Diagnosis not present

## 2023-12-16 DIAGNOSIS — H25012 Cortical age-related cataract, left eye: Secondary | ICD-10-CM | POA: Diagnosis not present

## 2023-12-16 DIAGNOSIS — H2512 Age-related nuclear cataract, left eye: Secondary | ICD-10-CM | POA: Diagnosis not present

## 2023-12-16 DIAGNOSIS — H25812 Combined forms of age-related cataract, left eye: Secondary | ICD-10-CM | POA: Diagnosis not present

## 2024-02-17 DIAGNOSIS — H25011 Cortical age-related cataract, right eye: Secondary | ICD-10-CM | POA: Diagnosis not present

## 2024-02-17 DIAGNOSIS — H25811 Combined forms of age-related cataract, right eye: Secondary | ICD-10-CM | POA: Diagnosis not present

## 2024-02-17 DIAGNOSIS — H2511 Age-related nuclear cataract, right eye: Secondary | ICD-10-CM | POA: Diagnosis not present

## 2024-02-17 DIAGNOSIS — Z961 Presence of intraocular lens: Secondary | ICD-10-CM | POA: Diagnosis not present

## 2024-03-20 ENCOUNTER — Ambulatory Visit (INDEPENDENT_AMBULATORY_CARE_PROVIDER_SITE_OTHER): Payer: Medicare Other | Admitting: Otolaryngology

## 2024-03-20 ENCOUNTER — Encounter (INDEPENDENT_AMBULATORY_CARE_PROVIDER_SITE_OTHER): Payer: Self-pay | Admitting: Otolaryngology

## 2024-03-20 VITALS — BP 109/70 | HR 68

## 2024-03-20 DIAGNOSIS — H6123 Impacted cerumen, bilateral: Secondary | ICD-10-CM | POA: Diagnosis not present

## 2024-03-20 DIAGNOSIS — H903 Sensorineural hearing loss, bilateral: Secondary | ICD-10-CM | POA: Diagnosis not present

## 2024-03-20 DIAGNOSIS — R42 Dizziness and giddiness: Secondary | ICD-10-CM

## 2024-03-20 NOTE — Progress Notes (Unsigned)
 Patient ID: Nancy Schmidt, female   DOB: 05-30-1941, 83 y.o.   MRN: [id number]  Hearing loss, dizziness  HPI: The patient is an 83 year old female who returns today for her follow-up evaluation.  She was previously seen for bilateral high-frequency sensorineural hearing loss.  The patient was previously fitted with bilateral hearing aids.  The patient returns today complaining of bilateral hearing difficulty.  She also complains of intermittent dizziness.  She was recently diagnosed with BPPV, and was successfully treated with Epley maneuver.  She currently wears her hearing aids daily.  She denies any otalgia or otorrhea.   Objective Objective note General: Communicates without difficulty, well nourished, no acute distress. Head: Normocephalic, no evidence injury, no tenderness, facial buttresses intact without stepoff. Eyes: PERRL, EOMI. No scleral icterus, conjunctivae clear. Neuro: CN II exam reveals vision grossly intact. No nystagmus at any point of gaze. EAC: Bilateral cerumen impaction.  Under the operating microscope, the cerumen is carefully removed with a combination of cerumen currette, alligator forceps, and suction catheters.  After the cerumen is removed, the TMs are noted to be normal.  Nose: External evaluation reveals normal support and skin without lesions. Dorsum is intact. Anterior rhinoscopy reveals healthy pink mucosa over anterior aspect of inferior turbinates and intact septum. No purulence noted. Oral:  Oral cavity and oropharynx are intact, symmetric, without erythema or edema. Mucosa is moist without lesions. Neck: Full range of motion without pain. There is no significant lymphadenopathy. No masses palpable. Thyroid  bed within normal limits to palpation. Parotid glands and submandibular glands equal bilaterally without mass. Trachea is midline. Neuro:  CN 2-12 grossly intact. Gait normal. Vestibular: No nystagmus at any point of gaze. The cerebellar examination is  unremarkable.     AUDIOMETRIC TESTING: I have read and reviewed the audiometric test, which shows bilateral severe high-frequency sensorineural hearing loss with a conductive component on the right. The speech reception threshold is 65dB AD and 50dB AS. The discrimination score is 56% AD and 64% AS. The tympanogram is normal bilaterally.    Observations Functional status No functional status recorded  Cognitive status No cognitive status recorded  Assessment Assessment note 1.  Bilateral cerumen impaction. The patient's ear canals, tympanic membranes and middle ear spaces are otherwise normal.   2.  Bilateral severe high-frequency sensorineural hearing loss, consistent with presbycusis.  A conductive hearing loss component is noted on the right.   3.  Recurrent dizziness, secondary to BPPV.  The patient is currently not symptomatic.   Screenings/Interventions/Assessments No screenings/interventions/assessments recorded  Diagnoses attached to encounter No diagnoses attached  Plan Plan note 1. Otomicroscopy with cerumen disimpaction.   2. The hearing test findings are reviewed with the patient.   3. Continue with bilateral hearing aids.   4. The patient will follow up for re-evaluation in 12 months, sooner if needed.

## 2024-03-22 DIAGNOSIS — R42 Dizziness and giddiness: Secondary | ICD-10-CM | POA: Insufficient documentation

## 2024-03-22 DIAGNOSIS — H6123 Impacted cerumen, bilateral: Secondary | ICD-10-CM | POA: Insufficient documentation

## 2024-03-22 DIAGNOSIS — H903 Sensorineural hearing loss, bilateral: Secondary | ICD-10-CM | POA: Insufficient documentation

## 2024-03-25 DIAGNOSIS — R197 Diarrhea, unspecified: Secondary | ICD-10-CM | POA: Diagnosis not present

## 2024-04-10 DIAGNOSIS — M5459 Other low back pain: Secondary | ICD-10-CM | POA: Diagnosis not present

## 2024-05-20 DIAGNOSIS — N182 Chronic kidney disease, stage 2 (mild): Secondary | ICD-10-CM | POA: Diagnosis not present

## 2024-05-20 DIAGNOSIS — I10 Essential (primary) hypertension: Secondary | ICD-10-CM | POA: Diagnosis not present

## 2024-05-20 DIAGNOSIS — K760 Fatty (change of) liver, not elsewhere classified: Secondary | ICD-10-CM | POA: Diagnosis not present

## 2024-05-20 DIAGNOSIS — F3342 Major depressive disorder, recurrent, in full remission: Secondary | ICD-10-CM | POA: Diagnosis not present

## 2024-05-20 DIAGNOSIS — E039 Hypothyroidism, unspecified: Secondary | ICD-10-CM | POA: Diagnosis not present

## 2024-05-26 DIAGNOSIS — F329 Major depressive disorder, single episode, unspecified: Secondary | ICD-10-CM | POA: Diagnosis not present

## 2024-05-26 DIAGNOSIS — I10 Essential (primary) hypertension: Secondary | ICD-10-CM | POA: Diagnosis not present

## 2024-05-26 DIAGNOSIS — E039 Hypothyroidism, unspecified: Secondary | ICD-10-CM | POA: Diagnosis not present
